# Patient Record
Sex: Female | Born: 1990 | Race: White | Hispanic: No | Marital: Married | Smoking: Former smoker
Health system: Southern US, Community
[De-identification: ages and names within clinical notes are randomized; demographics above are authoritative.]

## PROBLEM LIST (undated history)

## (undated) DIAGNOSIS — E039 Hypothyroidism, unspecified: Secondary | ICD-10-CM

## (undated) DIAGNOSIS — Z789 Other specified health status: Secondary | ICD-10-CM

## (undated) HISTORY — DX: Hypothyroidism, unspecified: E03.9

## (undated) HISTORY — PX: APPENDECTOMY: SHX54

---
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
## 2009-02-14 ENCOUNTER — Emergency Department (HOSPITAL_BASED_OUTPATIENT_CLINIC_OR_DEPARTMENT_OTHER): Admission: EM | Admit: 2009-02-14 | Discharge: 2009-02-14 | Payer: Self-pay | Admitting: Emergency Medicine

## 2009-02-18 ENCOUNTER — Ambulatory Visit: Payer: Self-pay | Admitting: Radiology

## 2009-02-18 ENCOUNTER — Ambulatory Visit (HOSPITAL_BASED_OUTPATIENT_CLINIC_OR_DEPARTMENT_OTHER): Admission: RE | Admit: 2009-02-18 | Discharge: 2009-02-18 | Payer: Self-pay | Admitting: Emergency Medicine

## 2009-08-11 ENCOUNTER — Emergency Department (HOSPITAL_COMMUNITY): Admission: EM | Admit: 2009-08-11 | Discharge: 2009-08-11 | Payer: Self-pay | Admitting: Family Medicine

## 2009-12-28 IMAGING — CT CT ABDOMEN W/ CM
2 of 4 series · 16 of 46 positions shown, 18 images · IV contrast (APPLIED)
Comparison: None

CT ABDOMEN

CLINICAL DATA: Right lower quadrant pain, nausea.

CT ABDOMEN AND PELVIS WITH CONTRAST
TECHNIQUE: Multidetector CT imaging of the abdomen and pelvis was
performed using the standard protocol following bolus
administration of intravenous contrast.
Contrast: 100 ml 7mnipaque-UDD

[Series 2: abd/pelvis 5.0 b31f · axial · 0.76mm/px · z∈[-477,-47]mm · 13 of 94 slices shown, 15 images]
[im 4/94  soft-tissue]
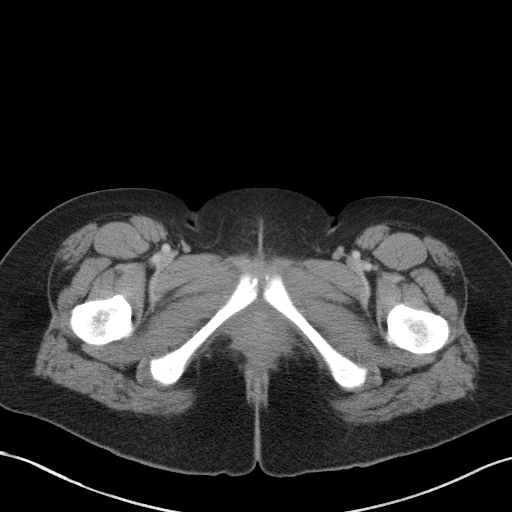
[im 4/94  bone]
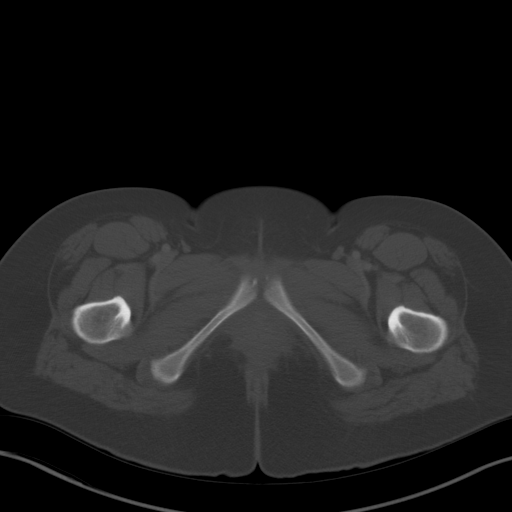
[im 12/94  soft-tissue]
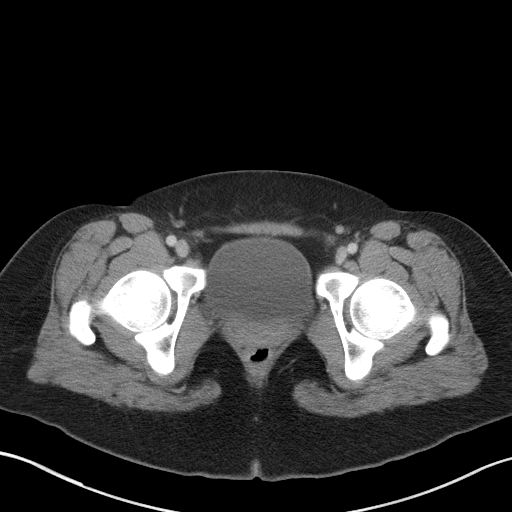
[im 20/94  soft-tissue]
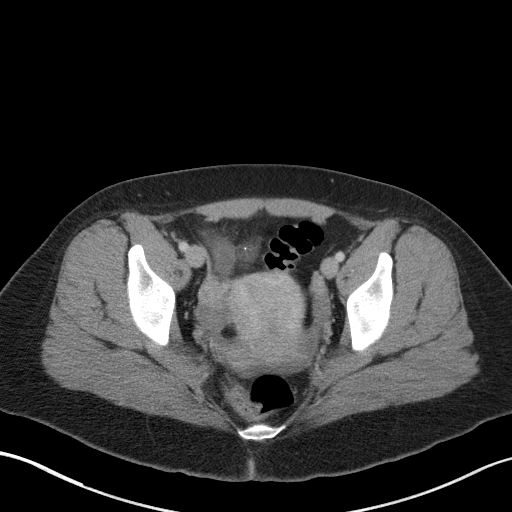
[im 28/94  soft-tissue]
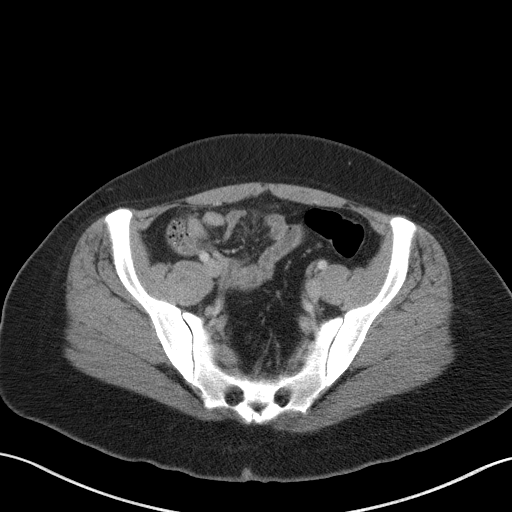
[im 32/94  soft-tissue]
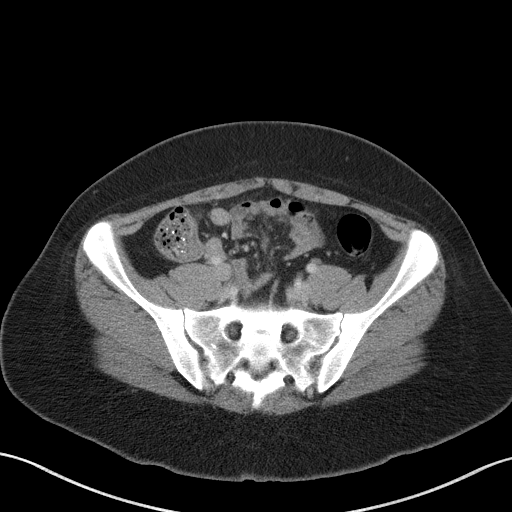
[im 39/94  soft-tissue]
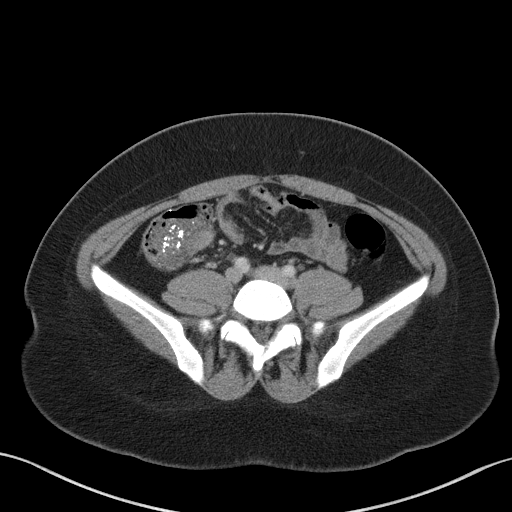
[im 47/94  soft-tissue]
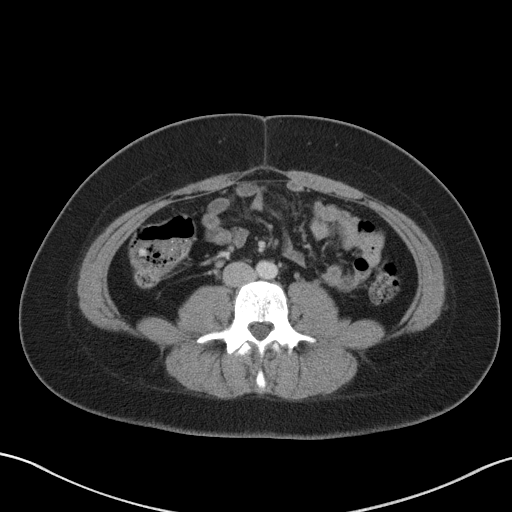
[im 55/94  soft-tissue]
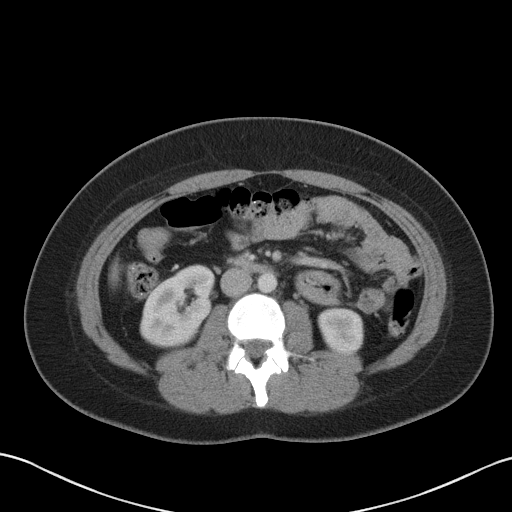
[im 63/94  soft-tissue]
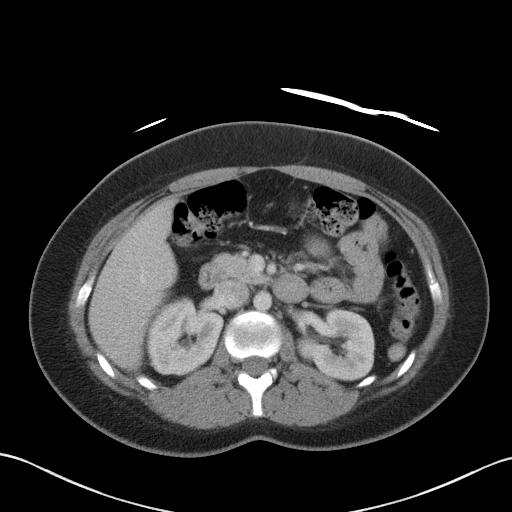
[im 63/94  bone]
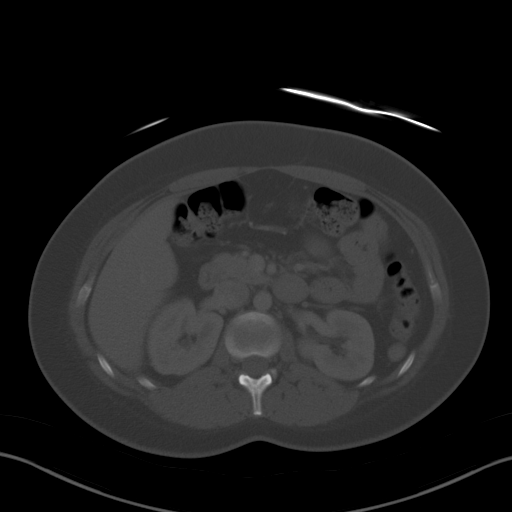
[im 66/94  soft-tissue]
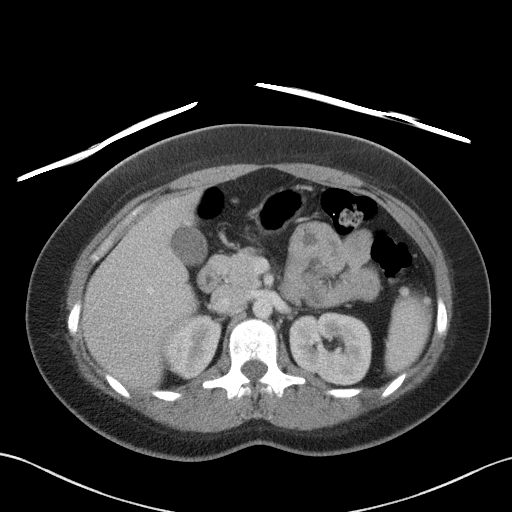
[im 74/94  soft-tissue]
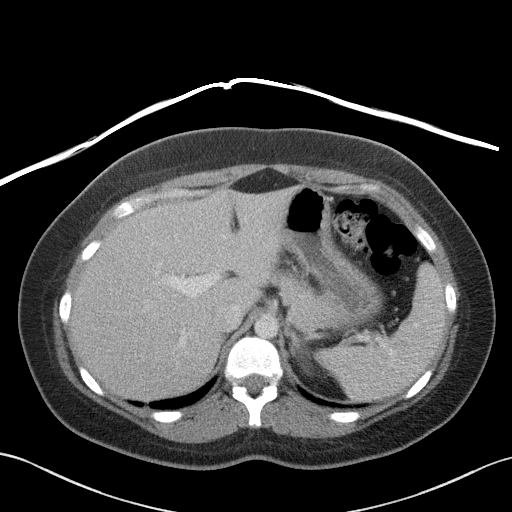
[im 82/94  soft-tissue]
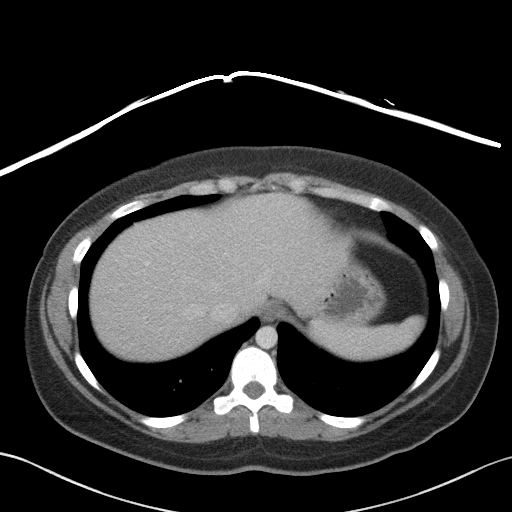
[im 90/94  soft-tissue]
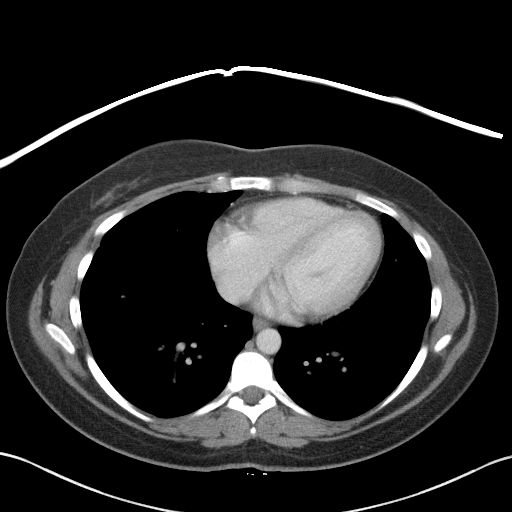

[Series 5: abd/pelvis 3.0 coronal · coronal · 0.78mm/px · 3 of 82 slices shown]
[im 28/82  soft-tissue]
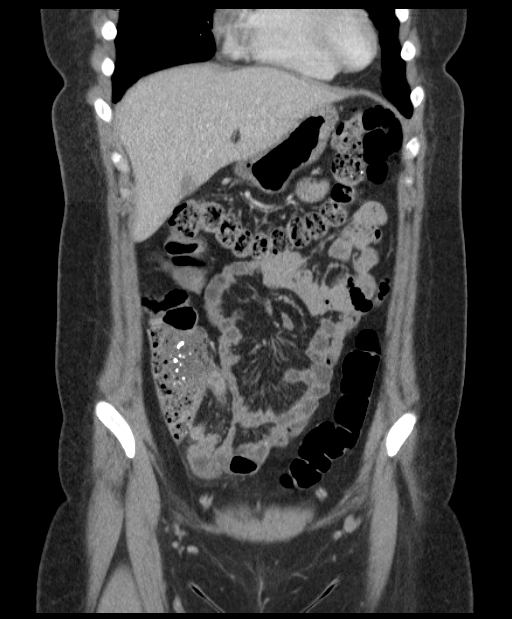
[im 37/82  soft-tissue]
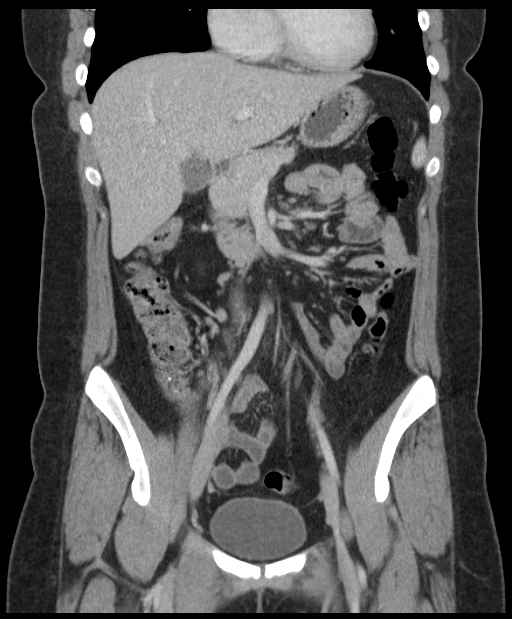
[im 46/82  soft-tissue]
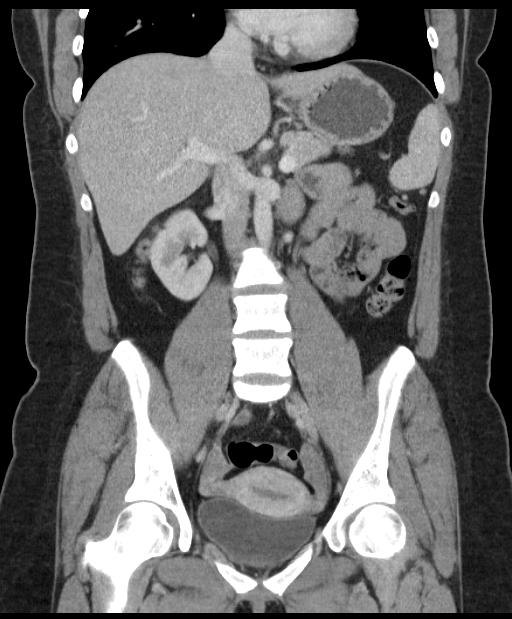

[16 of 46 positions shown; findings below may reference images not displayed]

FINDINGS: Liver, spleen, pancreas, adrenals, kidneys unremarkable.
No hydronephrosis. No biliary ductal dilatation. Bowel grossly
unremarkable.  No free fluid, free air, or adenopathy. Lung bases
are clear.  No effusions.  Heart is normal size. Aorta is normal
caliber.  No acute bony abnormality.
IMPRESSION: Unremarkable CT abdomen.

CT PELVIS
FINDINGS: The appendix is mildly dilated at 12 mm.  Early
stranding noted around the appendix.  There may be a small
appendicolith within the lumen.  Findings are concerning for early
appendicitis.

Small to moderate free fluid in the pelvis.  Uterus and adnexa
grossly unremarkable.  Pelvic small bowel unremarkable.

High density material noted within the distal small bowel and
cecum, presumably ingested material, possibly medications.
IMPRESSION: Mildly dilated appendix with early inflammatory change.  Findings
are concerning for early appendicitis.

Small to moderate free fluid in the pelvis.

## 2010-03-26 LAB — POCT URINALYSIS DIPSTICK
Hgb urine dipstick: NEGATIVE
Ketones, ur: NEGATIVE mg/dL
Nitrite: NEGATIVE
Specific Gravity, Urine: 1.015 (ref 1.005–1.030)
pH: 7.5 (ref 5.0–8.0)

## 2010-03-26 LAB — POCT PREGNANCY, URINE: Preg Test, Ur: NEGATIVE

## 2010-03-31 LAB — CBC
HCT: 40.2 % (ref 36.0–46.0)
Hemoglobin: 13.8 g/dL (ref 12.0–15.0)
WBC: 10.8 10*3/uL — ABNORMAL HIGH (ref 4.0–10.5)

## 2010-03-31 LAB — GC/CHLAMYDIA PROBE AMP, GENITAL: GC Probe Amp, Genital: NEGATIVE

## 2010-03-31 LAB — BASIC METABOLIC PANEL
Calcium: 9.7 mg/dL (ref 8.4–10.5)
Creatinine, Ser: 0.7 mg/dL (ref 0.4–1.2)
Glucose, Bld: 92 mg/dL (ref 70–99)
Potassium: 4.4 mEq/L (ref 3.5–5.1)

## 2010-03-31 LAB — URINALYSIS, ROUTINE W REFLEX MICROSCOPIC
Bilirubin Urine: NEGATIVE
Glucose, UA: NEGATIVE mg/dL
Ketones, ur: NEGATIVE mg/dL
Nitrite: NEGATIVE
Specific Gravity, Urine: 1.008 (ref 1.005–1.030)

## 2010-03-31 LAB — DIFFERENTIAL
Basophils Absolute: 0.3 10*3/uL — ABNORMAL HIGH (ref 0.0–0.1)
Basophils Relative: 3 % — ABNORMAL HIGH (ref 0–1)
Eosinophils Absolute: 0.1 10*3/uL (ref 0.0–0.7)
Eosinophils Relative: 1 % (ref 0–5)
Monocytes Absolute: 0.3 10*3/uL (ref 0.1–1.0)
Monocytes Relative: 3 % (ref 3–12)

## 2010-03-31 LAB — WET PREP, GENITAL: Yeast Wet Prep HPF POC: NONE SEEN

## 2010-03-31 LAB — PREGNANCY, URINE: Preg Test, Ur: NEGATIVE

## 2010-03-31 LAB — RPR: RPR Ser Ql: NONREACTIVE

## 2010-04-21 LAB — URINALYSIS, ROUTINE W REFLEX MICROSCOPIC
Bilirubin Urine: NEGATIVE
Glucose, UA: NEGATIVE mg/dL
Nitrite: NEGATIVE
Protein, ur: NEGATIVE mg/dL
Specific Gravity, Urine: 1.028 (ref 1.005–1.030)
pH: 6 (ref 5.0–8.0)

## 2010-04-21 LAB — COMPREHENSIVE METABOLIC PANEL
ALT: 17 U/L (ref 0–35)
AST: 21 U/L (ref 0–37)
Albumin: 4.3 g/dL (ref 3.5–5.2)
Alkaline Phosphatase: 75 U/L (ref 39–117)
GFR calc non Af Amer: >60 mL/min (ref >60)
Potassium: 3.9 mEq/L (ref 3.5–5.1)
Total Bilirubin: 0.6 mg/dL (ref 0.3–1.2)

## 2010-04-21 LAB — PREGNANCY, URINE: Preg Test, Ur: NEGATIVE

## 2010-04-21 LAB — DIFFERENTIAL
Basophils Absolute: 0.3 10*3/uL — ABNORMAL HIGH (ref 0.0–0.1)
Neutro Abs: 6.8 10*3/uL (ref 1.7–7.7)
Neutrophils Relative %: 67 % (ref 43–77)

## 2010-04-21 LAB — CBC
HCT: 35.5 % — ABNORMAL LOW (ref 36.0–46.0)
Hemoglobin: 12.3 g/dL (ref 12.0–15.0)
MCV: 84.4 fL (ref 78.0–100.0)
Platelets: 256 10*3/uL (ref 150–400)
RBC: 4.2 MIL/uL (ref 3.87–5.11)
RDW: 12.7 % (ref 11.5–15.5)
WBC: 10.2 10*3/uL (ref 4.0–10.5)

## 2010-04-21 LAB — LIPASE, BLOOD: Lipase: 42 U/L (ref 23–300)

## 2010-05-25 NOTE — Op Note (Signed)
NAMEECHO, PROPP               ACCOUNT NO.:  [id number]   MEDICAL RECORD NO.:  [id number]          PATIENT TYPE:  INP   LOCATION:  5506                         FACILITY:  [facility]   PHYSICIAN:  Velora Heckler, MD      DATE OF BIRTH:  20-Dec-1990   DATE OF PROCEDURE:  04/25/2008  DATE OF DISCHARGE:                               OPERATIVE REPORT   PREOPERATIVE DIAGNOSIS:  Acute appendicitis.   POSTOPERATIVE DIAGNOSIS:  Acute appendicitis.   PROCEDURE:  Laparoscopic appendectomy.   SURGEON:  Velora Heckler, MD, FACS   ANESTHESIA:  General per Dr. Sheldon Silvan.   ESTIMATED BLOOD LOSS:  Minimal.   PREPARATION:  ChloraPrep.   COMPLICATIONS:  None.   INDICATIONS:  The patient is a 20 year old white female who presents  with signs and symptoms of acute appendicitis.  She was seen at [facility].  She underwent CT scan abdomen and pelvis which  demonstrated findings consistent with acute appendicitis.  The patient  was transferred to [facility] and admitted on the General  Surgical Service.  She is now prepared and brought to the operating  room.   BODY OF REPORT:  Procedure was done in OR #70 at the [facility].  The patient was brought to the operating room,  placed in supine position on the operating room table.  Following  administration of general anesthesia, the patient was positioned and  then prepped and draped in usual strict aseptic fashion.  After  ascertaining that an adequate level of anesthesia had been achieved, an  infraumbilical incision was made in the midline with a #15 blade.  Dissection was carried down through subcutaneous tissues.  Fascia was  incised in the midline and the peritoneal cavity was entered cautiously.  A 0 Vicryl pursestring suture was placed in the fascia.  An Hasson  cannula was introduced and secured with the pursestring suture.  Abdomen  was insufflated with carbon dioxide.  Laparoscope was  introduced and the  abdomen was explored.  Operative ports were placed in the right upper  quadrant and left lower quadrant.  Omentum was mobilized.  Cecum was  lifted in the tense, distended, indurated, appendix was evident.  There  was no sign of perforation.  Using the Harmonic scalpel, the  mesoappendix was divided.  Dissection was carried down to the base of  the appendix and the appendix was cleared circumferentially.  Using an  Endo-GIA stapler, the base of the appendix was transected at its  junction with the cecal wall.  Good hemostasis was noted along the  staple line.  The appendix was placed into an EndoCatch bag and  withdrawn through the umbilical port without difficulty.  The 0 Vicryl  pursestring sutures tied securely.  Right lower quadrant was  irrigated  with warm saline.  Staple line was again inspected for hemostasis.  Fluid was evacuated.  No evidence of abscess.  Pneumoperitoneum was  released.  Ports were removed under direct vision and good hemostasis  was noted at all port sites.  Skin incisions were  anesthetized with  local anesthetic.  All wounds were  closed with interrupted 4-0 Monocryl subcuticular sutures.  Wounds were  washed and dried and benzoin and Steri-Strips were applied.  Sterile  dressings were applied.  The patient was awakened from anesthesia and  brought to the recovery room in stable condition.  The patient tolerated  the procedure well.       Velora Heckler, MD  Electronically Signed     TMG/MEDQ  D:  04/25/2008  T:  04/26/2008  Job:  [id number]   cc:   Velora Heckler, MD

## 2010-05-25 NOTE — Discharge Summary (Signed)
NAMEPAQUITA, Ann Hammond               ACCOUNT NO.:  [id number]   MEDICAL RECORD NO.:  [id number]          PATIENT TYPE:  INP   LOCATION:  5506                         FACILITY:  [facility]   PHYSICIAN:  Lennie Muckle, MD      DATE OF BIRTH:  10-08-1990   DATE OF ADMISSION:  04/25/2008  DATE OF DISCHARGE:  04/26/2008                               DISCHARGE SUMMARY   FINAL DIAGNOSIS:  Acute appendicitis.   PROCEDURE:  On April 25, 2008, laparoscopic appendectomy.   HOSPITAL COURSE:  [name] was admitted following laparoscopic  appendectomy for acute appendicitis on April 25, 2008.  She was kept  overnight for pain control and received IV postoperative antibiotics.  She tolerated breakfast this morning without nausea or vomiting.  Pain  has been controlled with Vicodin.  Incision sites have dressings which  are clean.  She has some mild abdominal distention, but overall is doing  well.  She will be discharged home with Vicodin.  She is instructed to  perform no heavy lifting over 20 pounds for 4 weeks.  Follow up in  clinic in approximately 2-3 weeks.  Call if she develops fevers, chills,  or increasing abdominal pain.  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   FINAL CONDITION:  Improved and discharged home.      Lennie Muckle, MD  Electronically Signed     ALA/MEDQ  D:  04/26/2008  T:  04/27/2008  Job:  [id number]   cc:   Velora Heckler, MD

## 2010-05-25 NOTE — H&P (Signed)
NAMEBREAWNA, Ann Hammond               ACCOUNT NO.:  [id number]   MEDICAL RECORD NO.:  [id number]          PATIENT TYPE:  INP   LOCATION:  5506                         FACILITY:  [facility]   PHYSICIAN:  Velora Heckler, MD      DATE OF BIRTH:  1990/07/09   DATE OF ADMISSION:  04/25/2008  DATE OF DISCHARGE:                              HISTORY & PHYSICAL   REQUESTING PHYSICIAN:  Annye Rusk, MD, with [facility] Emergency  Department.   ADMITTING PHYSICIAN:  Velora Heckler, MD   CHIEF COMPLAINT:  Abdominal pain.   HISTORY OF PRESENT ILLNESS:  Ann Hammond is a 20 year old white female who has  no past medical history, who began having periumbilical pain yesterday  around lunchtime.  She developed some nausea but no emesis.  Later in  the day, her pain moved to her right lower quadrant and worsened.  She  states at this time, she has no chance of being pregnant, and her last  menstrual cycle was approximately 3 weeks ago.  Due to her pain, she  presented to [facility] Emergency Department, where a CT scan of the  abdomen and pelvis was done, which showed early appendicitis.  Because  of this, she was transferred from [facility] Emergency Department to  [facility] for surgical intervention.   REVIEW OF SYSTEMS:  Please see HPI, otherwise all other systems are  negative.   FAMILY HISTORY:  Noncontributory.   PAST MEDICAL HISTORY:  None.   PAST SURGICAL HISTORY:  None.   SOCIAL HISTORY:  The patient has been married for 3 years.  She is a  social tobacco abuser.  Otherwise, denies alcohol or illicit drug use.  She currently does not have any kids.   ALLERGIES:  NKDA.   MEDICATIONS:  1. Calcium with vitamin D 600 mg b.i.d.  2. Norinyl, I believe this is her birth control that she takes daily.  3. Multivitamin.   PHYSICAL EXAMINATION:  GENERAL:  Ann Hammond is a 20 year old white female who  is currently lying in bed in no acute distress.  VITAL SIGNS:  Temperature 98.7, blood pressure 131/87,  pulse 98,  respirations 18.  HEENT:  Head is normocephalic, atraumatic.  Sclerae noninjected.  Pupils  are equal, round, and reactive to light.  Ears and nose without any  obvious masses or lesions.  No rhinorrhea.  Mouth is pink and moist.  Throat shows no exudate.  NECK:  Supple.  Trachea is midline.  No thyromegaly.  HEART:  Regular rate and rhythm.  Normal S1, S2.  No murmurs, gallops,  or rubs are noted.  Carotid, radial, and pedal pulses 2+ bilaterally.  LUNGS:  Clear to auscultation bilaterally with no wheezes, rhonchi, or  rales noted.  Respiratory effort is nonlabored.  ABDOMEN:  Soft.  Point tender in the right lower quadrant.  She does  have active bowel sounds and is otherwise nondistended.  She does not  have any peritoneal signs or other masses or hernias or scars noted.  SKIN:  Warm and dry.  NEURO:  Cranial nerves II through XII appear to be grossly intact.  PSYCH:  The patient is alert and oriented x3 with an appropriate affect.   LABORATORY DATA AND DIAGNOSTICS:  Sodium 141, potassium 3.9, glucose 86,  BUN 12, creatinine 0.7.  White blood cell count is 10.2, hemoglobin  12.3, hematocrit 35.5, platelet count is 256,000.  Diagnostics, CT of  the abdomen and pelvis revealed a mildly dilated appendix with early  inflammatory changes consistent with early appendicitis.   IMPRESSION:  Acute appendicitis.   PLAN:  At this time, the patient is admitted.  She has been started on  IV fluids as well as Zosyn prophylactically.  At this time, the risks  and benefits of surgery has been explained to the patient including  bleeding, infection, and injury to surrounding organs.  The patient  understands these risks, and at this time, she wishes to proceed with  surgical intervention.      Letha Cape, PA      Velora Heckler, MD  Electronically Signed    KEO/MEDQ  D:  04/25/2008  T:  04/26/2008  Job:  [id number]

## 2010-07-22 LAB — GC/CHLAMYDIA PROBE AMP, GENITAL: Gonorrhea: NEGATIVE

## 2010-08-16 LAB — HIV ANTIBODY (ROUTINE TESTING W REFLEX): HIV: NONREACTIVE

## 2010-08-16 LAB — ABO/RH: RH Type: NEGATIVE

## 2010-08-16 LAB — ANTIBODY SCREEN: Antibody Screen: NEGATIVE

## 2010-08-16 LAB — HEPATITIS B SURFACE ANTIGEN: Hepatitis B Surface Ag: NEGATIVE

## 2010-10-23 IMAGING — US US TRANSVAGINAL NON-OB
1 series · 14 of 25 positions shown · non-contrast
Comparison: None.

February 20, 2009 –DUPLICATE COPY for exam association in [facility]. No change from original report.
CLINICAL DATA: Heavy vaginal bleeding

 TRANSABDOMINAL AND TRANSVAGINAL ULTRASOUND OF PELVIS
TECHNIQUE: Both transabdominal and transvaginal ultrasound
 examinations of the pelvis were performed including evaluation of
 the uterus, ovaries, adnexal regions, and pelvic cul-de-sac.

[Series 1: us transvaginal non-ob · 0.35mm/px · 14 of 54 slices shown]
[im 1/54]
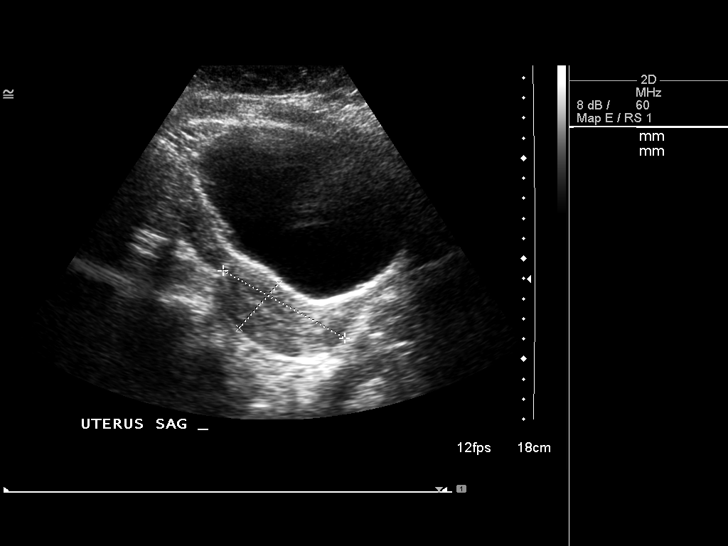
[im 5/54]
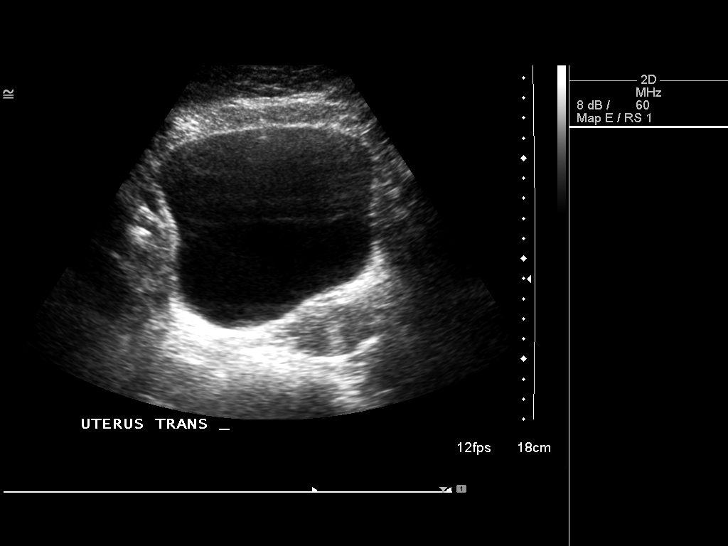
[im 9/54]
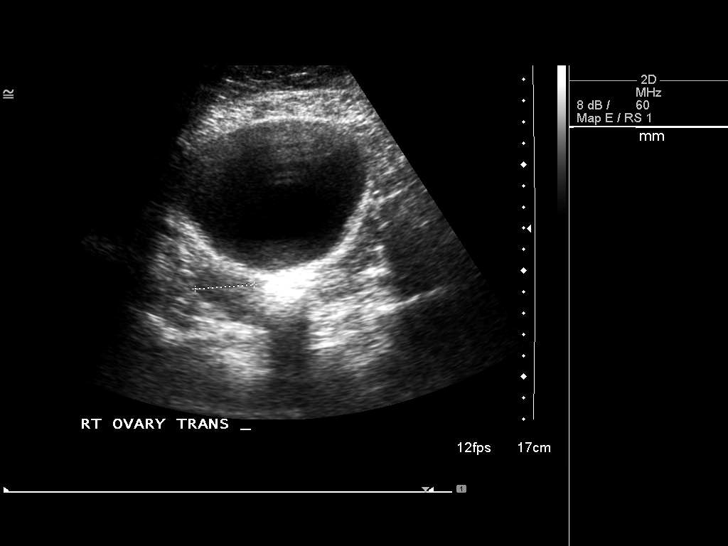
[im 14/54]
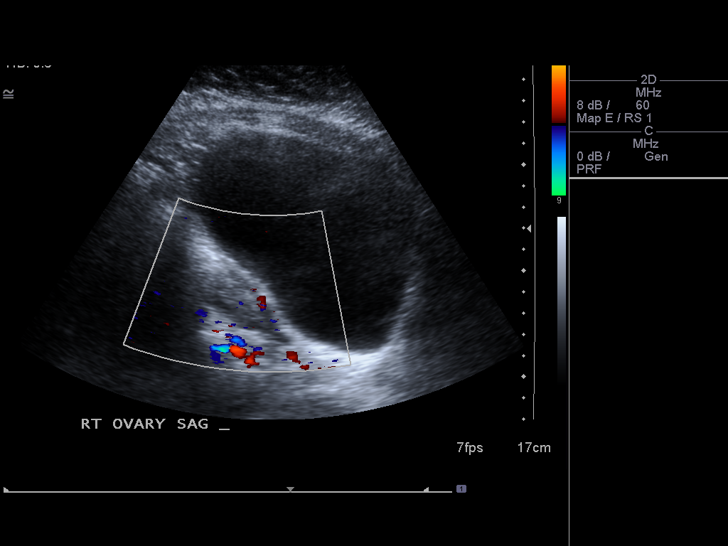
[im 18/54]
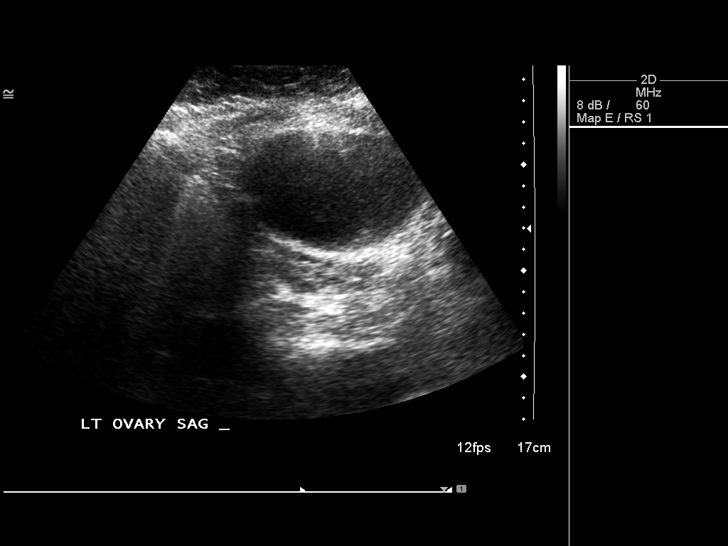
[im 20/54]
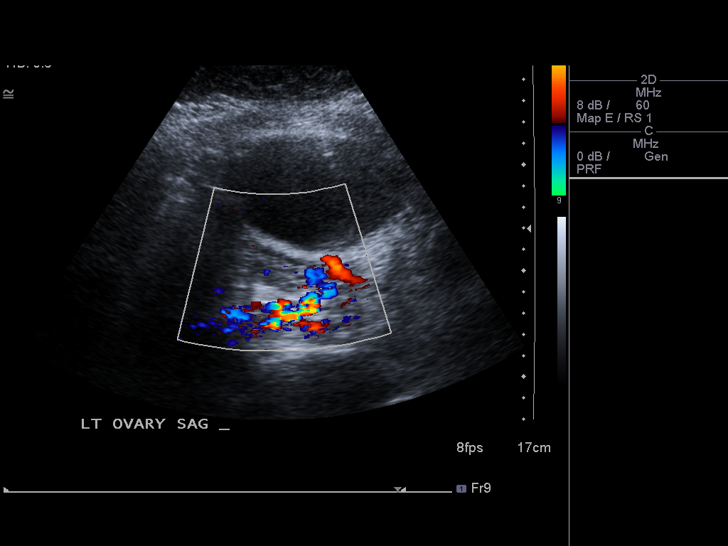
[im 25/54]
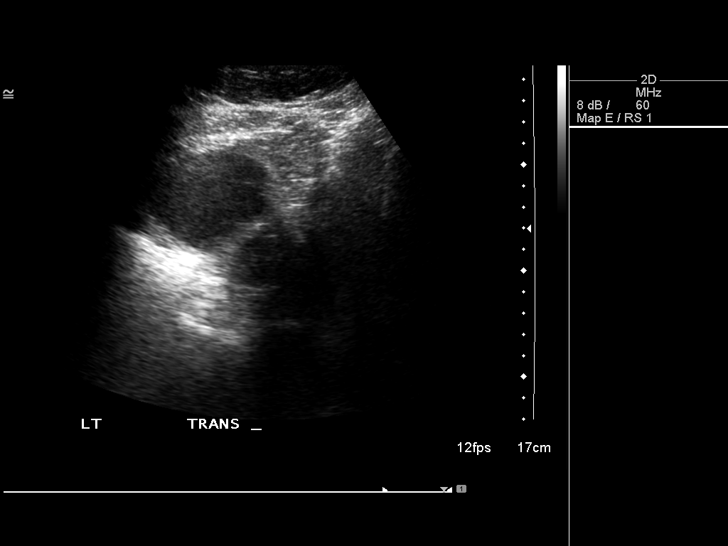
[im 29/54]
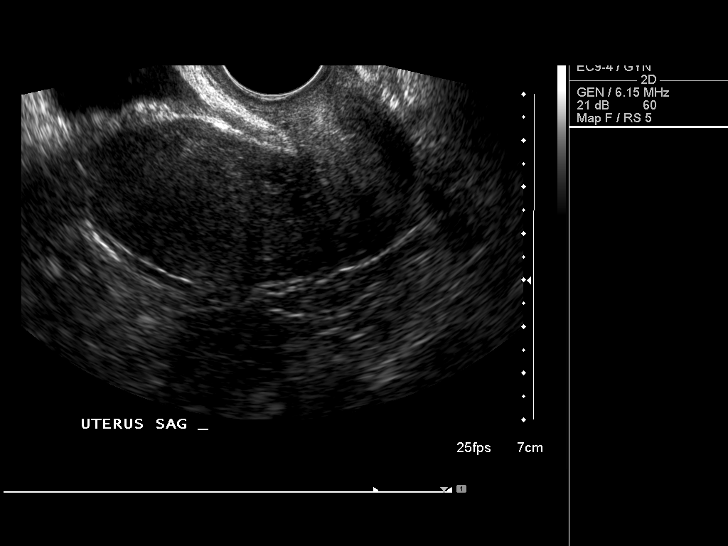
[im 34/54]
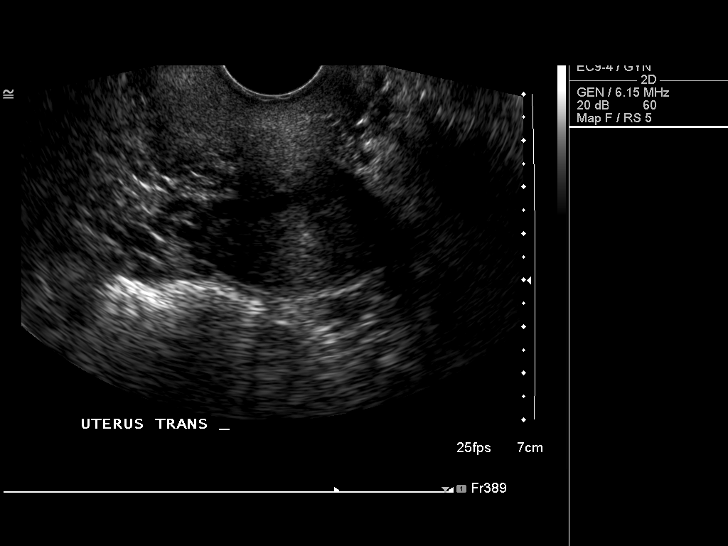
[im 36/54]
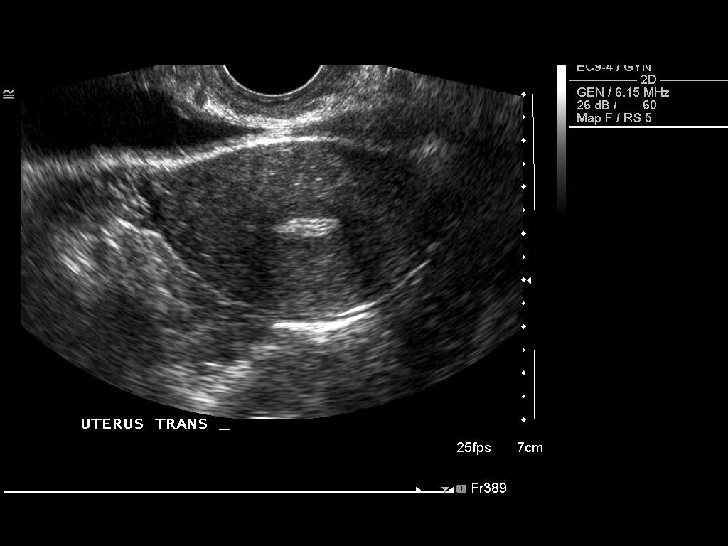
[im 40/54]
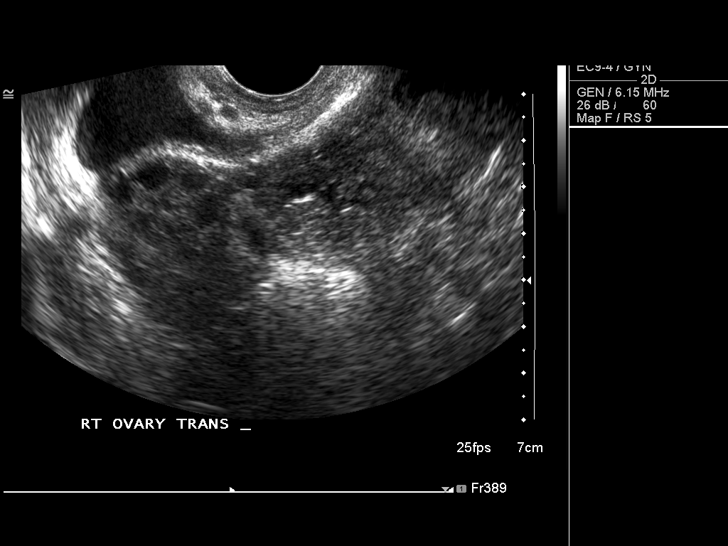
[im 45/54]
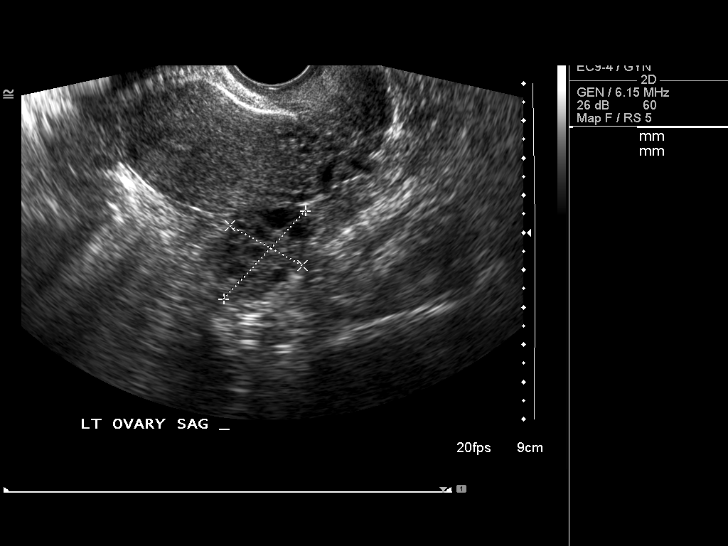
[im 49/54]
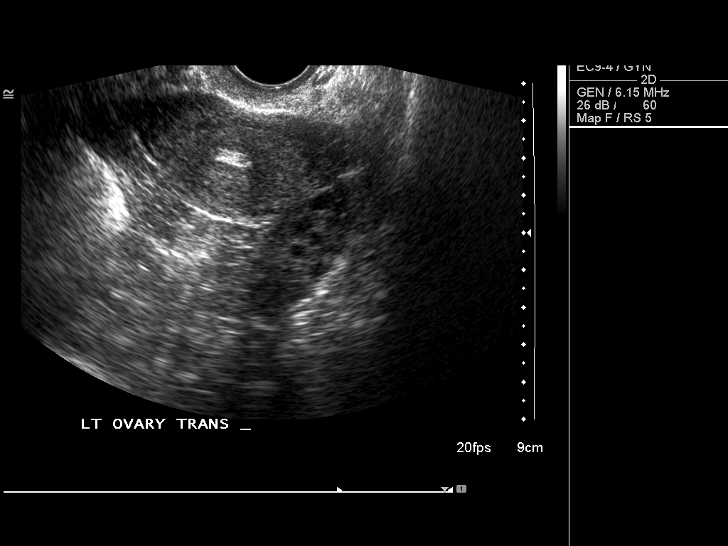
[im 54/54]
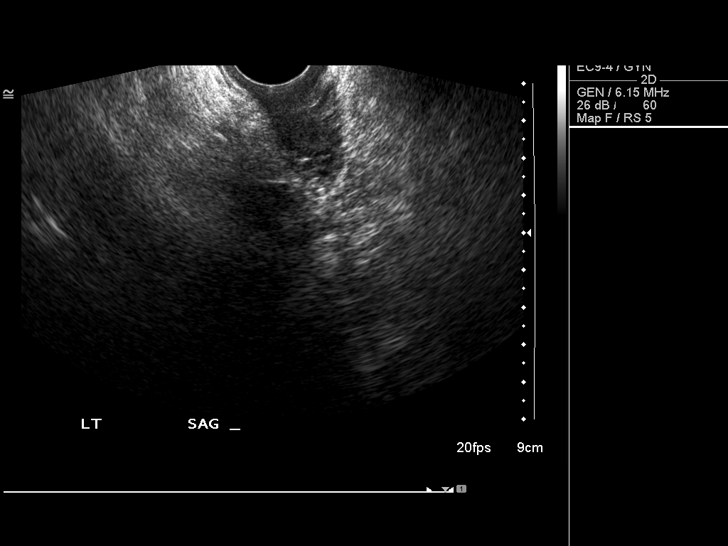

[14 of 25 positions shown; findings below may reference images not displayed]

FINDINGS: Uterus normal in size and contour. No focal lesions of the
 myometrium.

 Endometrium 4 mm in thickness. There is a small amount of fluid in
 the endometrial canal.

 Right Ovary normal in size and contour with several follicles. No
 dominant cysts or masses.

 Left Ovary normal in size and contour with several follicles. No
 dominant cysts or masses.

 Other Findings: No free pelvic fluid or other significant
 findings.
IMPRESSION: Normal exam. There is a small amount of fluid in the endometrial
 canal.

## 2011-01-11 NOTE — L&D Delivery Note (Signed)
NSD of viable 7 pound 15 oz. Female, Apgars 8/9 over 3rd degree tear.  Meconium Stained AF noted earlier with amniotomy--however baby showed no effects from mec - Dr. Alison Murray present shortly after delivery.  Baby to be breast fed and to regular nursery.  Placenta with slightly stained membranes.  Grossly noraml otherwise.  2A/1V.  Tear repaired without difficulty with 2-0 and 3-0 vicryl.

## 2011-01-27 LAB — STREP B DNA PROBE: GBS: NEGATIVE

## 2011-02-19 ENCOUNTER — Inpatient Hospital Stay (HOSPITAL_COMMUNITY)
Admission: AD | Admit: 2011-02-19 | Discharge: 2011-02-19 | Disposition: A | Discharge status: Home / Self Care | Payer: 59 | Source: Ambulatory Visit | Attending: Obstetrics & Gynecology | Admitting: Obstetrics & Gynecology

## 2011-02-19 ENCOUNTER — Inpatient Hospital Stay (HOSPITAL_COMMUNITY)
Admission: AD | Admit: 2011-02-19 | Discharge: 2011-02-20 | Disposition: A | Discharge status: Home / Self Care | Payer: 59 | Source: Ambulatory Visit | Attending: Obstetrics & Gynecology | Admitting: Obstetrics & Gynecology

## 2011-02-19 ENCOUNTER — Encounter (HOSPITAL_COMMUNITY): Payer: Self-pay | Admitting: *Deleted

## 2011-02-19 DIAGNOSIS — O479 False labor, unspecified: Secondary | ICD-10-CM | POA: Insufficient documentation

## 2011-02-19 HISTORY — DX: Other specified health status: Z78.9

## 2011-02-19 NOTE — Progress Notes (Signed)
Notified of no change in SVE from office. Normal bloody show noted.  Order to d/c pt home.

## 2011-02-19 NOTE — Progress Notes (Signed)
Pt reports having back and abd cramping on and off since 1am. Reports some vaginal bleeding and leaking noted. Reports good fetal movement

## 2011-02-19 NOTE — Progress Notes (Signed)
Pt states, " I've been here twice today, but now I'm hurting more and they contractions are more regular and about three minutes aprt."

## 2011-02-20 ENCOUNTER — Encounter (HOSPITAL_COMMUNITY): Payer: Self-pay | Admitting: Anesthesiology

## 2011-02-20 ENCOUNTER — Inpatient Hospital Stay (HOSPITAL_COMMUNITY)
Admission: AD | Admit: 2011-02-20 | Discharge: 2011-02-22 | DRG: 775 | Disposition: A | Discharge status: Home / Self Care | Payer: 59 | Source: Ambulatory Visit | Attending: Obstetrics & Gynecology | Admitting: Obstetrics & Gynecology

## 2011-02-20 ENCOUNTER — Encounter (HOSPITAL_COMMUNITY): Payer: Self-pay | Admitting: Obstetrics and Gynecology

## 2011-02-20 ENCOUNTER — Inpatient Hospital Stay (HOSPITAL_COMMUNITY): Payer: 59 | Admitting: Anesthesiology

## 2011-02-20 ENCOUNTER — Encounter (HOSPITAL_COMMUNITY): Payer: Self-pay | Admitting: *Deleted

## 2011-02-20 LAB — CBC
HCT: 37.5 % (ref 36.0–46.0)
Platelets: 171 10*3/uL (ref 150–400)
RDW: 13.2 % (ref 11.5–15.5)
WBC: 13.2 10*3/uL — ABNORMAL HIGH (ref 4.0–10.5)

## 2011-02-20 LAB — RPR: RPR Ser Ql: NONREACTIVE

## 2011-02-20 LAB — ABO/RH: ABO/RH(D): B NEG

## 2011-02-20 MED ORDER — DIPHENHYDRAMINE HCL 50 MG/ML IJ SOLN: 12.5000 mg | INTRAMUSCULAR | Status: DC | PRN

## 2011-02-20 MED ORDER — ACETAMINOPHEN 325 MG PO TABS: 650.0000 mg | ORAL_TABLET | ORAL | Status: DC | PRN

## 2011-02-20 MED ORDER — ONDANSETRON HCL 4 MG/2ML IJ SOLN: 4.0000 mg | Freq: Four times a day (QID) | INTRAMUSCULAR | Status: DC | PRN

## 2011-02-20 MED ORDER — CITRIC ACID-SODIUM CITRATE 334-500 MG/5ML PO SOLN: 30.0000 mL | ORAL | Status: DC | PRN

## 2011-02-20 MED ORDER — OXYCODONE-ACETAMINOPHEN 5-325 MG PO TABS
1.0000 | ORAL_TABLET | ORAL | Status: DC | PRN
  Administered 2011-02-20: 2 via ORAL
  Filled 2011-02-20: qty 2

## 2011-02-20 MED ORDER — FENTANYL 2.5 MCG/ML BUPIVACAINE 1/10 % EPIDURAL INFUSION (WH - ANES)
INTRAMUSCULAR | Status: DC | PRN
  Administered 2011-02-20: 14 mL/h via EPIDURAL

## 2011-02-20 MED ORDER — LACTATED RINGERS IV SOLN: 500.0000 mL | INTRAVENOUS | Status: DC | PRN

## 2011-02-20 MED ORDER — LIDOCAINE HCL (PF) 1 % IJ SOLN
30.0000 mL | INTRAMUSCULAR | Status: DC | PRN
  Filled 2011-02-20: qty 30

## 2011-02-20 MED ORDER — EPHEDRINE 5 MG/ML INJ: 10.0000 mg | INTRAVENOUS | Status: DC | PRN

## 2011-02-20 MED ORDER — PHENYLEPHRINE 40 MCG/ML (10ML) SYRINGE FOR IV PUSH (FOR BLOOD PRESSURE SUPPORT): 80.0000 ug | PREFILLED_SYRINGE | INTRAVENOUS | Status: DC | PRN

## 2011-02-20 MED ORDER — LACTATED RINGERS IV SOLN
500.0000 mL | Freq: Once | INTRAVENOUS | Status: AC
  Administered 2011-02-20: 500 mL via INTRAVENOUS

## 2011-02-20 MED ORDER — LIDOCAINE HCL (PF) 1 % IJ SOLN
INTRAMUSCULAR | Status: DC | PRN
  Administered 2011-02-20 (×2): 5 mL

## 2011-02-20 MED ORDER — LACTATED RINGERS IV SOLN: INTRAVENOUS | Status: DC

## 2011-02-20 MED ORDER — LIDOCAINE HCL (PF) 1 % IJ SOLN: 30.0000 mL | INTRAMUSCULAR | Status: DC | PRN

## 2011-02-20 MED ORDER — FENTANYL 2.5 MCG/ML BUPIVACAINE 1/10 % EPIDURAL INFUSION (WH - ANES): 14.0000 mL/h | INTRAMUSCULAR | Status: DC

## 2011-02-20 MED ORDER — ZOLPIDEM TARTRATE 5 MG PO TABS
5.0000 mg | ORAL_TABLET | Freq: Once | ORAL | Status: AC
  Administered 2011-02-20: 5 mg via ORAL
  Filled 2011-02-20: qty 1

## 2011-02-20 MED ORDER — OXYTOCIN BOLUS FROM INFUSION
500.0000 mL | Freq: Once | INTRAVENOUS | Status: DC
  Filled 2011-02-20: qty 500
  Filled 2011-02-20: qty 1000

## 2011-02-20 MED ORDER — LACTATED RINGERS IV SOLN
INTRAVENOUS | Status: DC
  Administered 2011-02-20 (×2): via INTRAVENOUS

## 2011-02-20 MED ORDER — BUTORPHANOL TARTRATE 2 MG/ML IJ SOLN: 1.0000 mg | INTRAMUSCULAR | Status: DC | PRN

## 2011-02-20 MED ORDER — OXYTOCIN BOLUS FROM INFUSION
500.0000 mL | Freq: Once | INTRAVENOUS | Status: DC
  Filled 2011-02-20: qty 500

## 2011-02-20 MED ORDER — OXYTOCIN 20 UNITS IN LACTATED RINGERS INFUSION - SIMPLE: 125.0000 mL/h | Freq: Once | INTRAVENOUS | Status: DC

## 2011-02-20 MED ORDER — PHENYLEPHRINE 40 MCG/ML (10ML) SYRINGE FOR IV PUSH (FOR BLOOD PRESSURE SUPPORT)
PREFILLED_SYRINGE | INTRAVENOUS | Status: AC
  Filled 2011-02-20: qty 5

## 2011-02-20 MED ORDER — OXYTOCIN 20 UNITS IN LACTATED RINGERS INFUSION - SIMPLE
125.0000 mL/h | Freq: Once | INTRAVENOUS | Status: AC
  Administered 2011-02-20: 999 mL/h via INTRAVENOUS

## 2011-02-20 MED ORDER — IBUPROFEN 600 MG PO TABS
600.0000 mg | ORAL_TABLET | Freq: Four times a day (QID) | ORAL | Status: DC | PRN
  Administered 2011-02-20: 600 mg via ORAL
  Filled 2011-02-20: qty 1

## 2011-02-20 MED ORDER — IBUPROFEN 600 MG PO TABS: 600.0000 mg | ORAL_TABLET | Freq: Four times a day (QID) | ORAL | Status: DC | PRN

## 2011-02-20 MED ORDER — EPHEDRINE 5 MG/ML INJ
INTRAVENOUS | Status: AC
  Filled 2011-02-20: qty 4

## 2011-02-20 MED ORDER — FLEET ENEMA 7-19 GM/118ML RE ENEM: 1.0000 | ENEMA | RECTAL | Status: DC | PRN

## 2011-02-20 MED ORDER — OXYCODONE-ACETAMINOPHEN 5-325 MG PO TABS: 1.0000 | ORAL_TABLET | ORAL | Status: DC | PRN

## 2011-02-20 MED ORDER — FENTANYL 2.5 MCG/ML BUPIVACAINE 1/10 % EPIDURAL INFUSION (WH - ANES)
INTRAMUSCULAR | Status: AC
  Filled 2011-02-20: qty 60

## 2011-02-20 NOTE — Progress Notes (Signed)
ABO and Rh ordered.  Hospital has patient as O-; office has patient as B-.

## 2011-02-20 NOTE — Progress Notes (Signed)
"  I was here earlier, but now the UC's are way worse and closer together.  (+) FM and continue to have mucous-like bleeding."

## 2011-02-20 NOTE — Progress Notes (Signed)
Contractions have continued since yesterday, bloody show, contractions back to back.

## 2011-02-20 NOTE — H&P (Signed)
21 year old G1P0 at 43 weeks, EDC 02-27-11, admitted in labor.  Pregnancy relatively benign.  Infertility patient.  -GBS.    PE:  VS stable Abd;  FH reassuring.  Term size Cx;  By nurse in MAU- 5/90/vtx-1  Imp:  Term IUP Plan Expectant management

## 2011-02-20 NOTE — Anesthesia Procedure Notes (Signed)
Epidural Patient location during procedure: OB Start time: 02/20/2011 6:50 PM  Staffing Anesthesiologist: Eeshan Verbrugge A. Performed by: anesthesiologist   Preanesthetic Checklist Completed: patient identified, site marked, surgical consent, pre-op evaluation, timeout performed, IV checked, risks and benefits discussed and monitors and equipment checked  Epidural Patient position: sitting Prep: site prepped and draped and DuraPrep Patient monitoring: continuous pulse ox and blood pressure Approach: midline Injection technique: LOR air  Needle:  Needle type: Tuohy  Needle gauge: 17 G Needle length: 9 cm Needle insertion depth: 6 cm Catheter type: closed end flexible Catheter size: 19 Gauge Catheter at skin depth: 11 cm Test dose: negative and Other  Assessment Events: blood not aspirated, injection not painful, no injection resistance, negative IV test and no paresthesia  Additional Notes Patient identified. Risks and benefits discussed including failed block, incomplete  Pain control, post dural puncture headache, nerve damage, paralysis, blood pressure Changes, nausea, vomiting, reactions to medications-both toxic and allergic and post Partum back pain. All questions were answered. Patient expressed understanding and wished to proceed. Sterile technique was used throughout procedure. Epidural site was Dressed with sterile barrier dressing. No paresthesias, signs of intravascular injection Or signs of intrathecal spread were encountered. Attempts x 3 due to poor positioning. Patient was more comfortable after the epidural was dosed. Please see RN's note for documentation of vital signs and FHR which are stable.

## 2011-02-20 NOTE — Anesthesia Preprocedure Evaluation (Addendum)
Anesthesia Evaluation  Patient identified by MRN, date of birth, ID band Patient awake    Reviewed: Allergy & Precautions, H&P , Patient's Chart, lab work & pertinent test results  Airway Mallampati: II TM Distance: >3 FB Neck ROM: full    Dental No notable dental hx. (+) Teeth Intact   Pulmonary neg pulmonary ROS,  clear to auscultation  Pulmonary exam normal       Cardiovascular neg cardio ROS regular Normal    Neuro/Psych Negative Neurological ROS  Negative Psych ROS   GI/Hepatic negative GI ROS, Neg liver ROS,   Endo/Other  Negative Endocrine ROSMorbid obesity  Renal/GU negative Renal ROS  Genitourinary negative   Musculoskeletal negative musculoskeletal ROS (+)   Abdominal   Peds  Hematology negative hematology ROS (+)   Anesthesia Other Findings   Reproductive/Obstetrics (+) Pregnancy                          Anesthesia Physical Anesthesia Plan  ASA: II  Anesthesia Plan: Epidural   Post-op Pain Management:    Induction:   Airway Management Planned:   Additional Equipment:   Intra-op Plan:   Post-operative Plan:   Informed Consent: I have reviewed the patients History and Physical, chart, labs and discussed the procedure including the risks, benefits and alternatives for the proposed anesthesia with the patient or authorized representative who has indicated his/her understanding and acceptance.     Plan Discussed with: Surgeon and Anesthesiologist  Anesthesia Plan Comments:         Anesthesia Quick Evaluation

## 2011-02-21 LAB — CBC
MCH: 29.4 pg (ref 26.0–34.0)
MCHC: 33.6 g/dL (ref 30.0–36.0)
Platelets: 165 10*3/uL (ref 150–400)
RBC: 3.44 MIL/uL — ABNORMAL LOW (ref 3.87–5.11)

## 2011-02-21 MED ORDER — DIPHENHYDRAMINE HCL 25 MG PO CAPS: 25.0000 mg | ORAL_CAPSULE | Freq: Four times a day (QID) | ORAL | Status: DC | PRN

## 2011-02-21 MED ORDER — ZOLPIDEM TARTRATE 5 MG PO TABS: 5.0000 mg | ORAL_TABLET | Freq: Every evening | ORAL | Status: DC | PRN

## 2011-02-21 MED ORDER — SENNOSIDES-DOCUSATE SODIUM 8.6-50 MG PO TABS
2.0000 | ORAL_TABLET | Freq: Every day | ORAL | Status: DC
  Administered 2011-02-21: 2 via ORAL

## 2011-02-21 MED ORDER — OXYCODONE-ACETAMINOPHEN 5-325 MG PO TABS
1.0000 | ORAL_TABLET | ORAL | Status: DC | PRN
  Administered 2011-02-21: 2 via ORAL
  Administered 2011-02-21 (×5): 1 via ORAL
  Filled 2011-02-21 (×2): qty 1
  Filled 2011-02-21: qty 2
  Filled 2011-02-21 (×3): qty 1

## 2011-02-21 MED ORDER — SODIUM CHLORIDE 0.9 % IJ SOLN: 3.0000 mL | INTRAMUSCULAR | Status: DC | PRN

## 2011-02-21 MED ORDER — SODIUM CHLORIDE 0.9 % IV SOLN: 250.0000 mL | INTRAVENOUS | Status: DC | PRN

## 2011-02-21 MED ORDER — ONDANSETRON HCL 4 MG PO TABS: 4.0000 mg | ORAL_TABLET | ORAL | Status: DC | PRN

## 2011-02-21 MED ORDER — BENZOCAINE-MENTHOL 20-0.5 % EX AERO
1.0000 "application " | INHALATION_SPRAY | CUTANEOUS | Status: DC | PRN
  Administered 2011-02-21: 1 via TOPICAL

## 2011-02-21 MED ORDER — SIMETHICONE 80 MG PO CHEW: 80.0000 mg | CHEWABLE_TABLET | ORAL | Status: DC | PRN

## 2011-02-21 MED ORDER — PRENATAL MULTIVITAMIN CH
1.0000 | ORAL_TABLET | Freq: Every day | ORAL | Status: DC
  Administered 2011-02-21: 1 via ORAL
  Filled 2011-02-21: qty 1

## 2011-02-21 MED ORDER — IBUPROFEN 600 MG PO TABS
600.0000 mg | ORAL_TABLET | Freq: Four times a day (QID) | ORAL | Status: DC
  Administered 2011-02-21 – 2011-02-22 (×5): 600 mg via ORAL
  Filled 2011-02-21 (×5): qty 1

## 2011-02-21 MED ORDER — DIBUCAINE 1 % RE OINT: 1.0000 "application " | TOPICAL_OINTMENT | RECTAL | Status: DC | PRN

## 2011-02-21 MED ORDER — WITCH HAZEL-GLYCERIN EX PADS: 1.0000 "application " | MEDICATED_PAD | CUTANEOUS | Status: DC | PRN

## 2011-02-21 MED ORDER — ONDANSETRON HCL 4 MG/2ML IJ SOLN: 4.0000 mg | INTRAMUSCULAR | Status: DC | PRN

## 2011-02-21 MED ORDER — TETANUS-DIPHTH-ACELL PERTUSSIS 5-2.5-18.5 LF-MCG/0.5 IM SUSP: 0.5000 mL | Freq: Once | INTRAMUSCULAR | Status: DC

## 2011-02-21 MED ORDER — BENZOCAINE-MENTHOL 20-0.5 % EX AERO
INHALATION_SPRAY | CUTANEOUS | Status: AC
  Administered 2011-02-21: 1 via TOPICAL
  Filled 2011-02-21: qty 56

## 2011-02-21 MED ORDER — RHO D IMMUNE GLOBULIN 1500 UNIT/2ML IJ SOLN
300.0000 ug | Freq: Once | INTRAMUSCULAR | Status: AC
  Administered 2011-02-21: 300 ug via INTRAMUSCULAR
  Filled 2011-02-21: qty 2

## 2011-02-21 MED ORDER — LANOLIN HYDROUS EX OINT: TOPICAL_OINTMENT | CUTANEOUS | Status: DC | PRN

## 2011-02-21 MED ORDER — SODIUM CHLORIDE 0.9 % IJ SOLN: 3.0000 mL | Freq: Two times a day (BID) | INTRAMUSCULAR | Status: DC

## 2011-02-21 NOTE — Discharge Summary (Signed)
Obstetric Discharge Summary Reason for Admission: onset of labor Prenatal Procedures: none Intrapartum Procedures: spontaneous vaginal delivery Postpartum Procedures: none Complications-Operative and Postpartum: 3rd degree perineal laceration Hemoglobin  Date Value Range Status  02/21/2011 10.1* 12.0-15.0 (g/dL) Final     DELTA CHECK NOTED     REPEATED TO VERIFY     HCT  Date Value Range Status  02/21/2011 30.1* 36.0-46.0 (%) Final    Discharge Diagnoses: Term Pregnancy-delivered  Discharge Information: Date: 02/21/2011 Activity: pelvic rest Diet: routine Medications: Ibuprofen Condition: stable Instructions: refer to practice specific booklet Discharge to: home   Newborn Data: Live born female  Birth Weight: 7 lb 15.2 oz (3605 g) APGAR: 8, 9  Home with mother.  Ann Hammond A 02/21/2011, 9:41 PM

## 2011-02-21 NOTE — Anesthesia Postprocedure Evaluation (Signed)
  Anesthesia Post-op Note  Patient: Ann Hammond  Procedure(s) Performed: * No procedures listed *  Patient Location: Mother/Baby  Anesthesia Type: Epidural  Level of Consciousness: alert  and oriented  Airway and Oxygen Therapy: Patient Spontanous Breathing  Post-op Pain: mild  Post-op Assessment: Patient's Cardiovascular Status Stable and Respiratory Function Stable  Post-op Vital Signs: stable  Complications: No apparent anesthesia complications

## 2011-02-21 NOTE — Progress Notes (Signed)
Patient is eating, ambulating, voiding.  Pain control is good.  Filed Vitals:   02/21/11 0515 02/21/11 1345 02/21/11 2138 02/22/11 0550  BP: 115/72 109/69 114/74 111/75  Pulse: 105 100 106 100  Temp: 98.2 F (36.8 C) 98 F (36.7 C) 98.7 F (37.1 C) 98.5 F (36.9 C)  TempSrc:  Oral Oral Oral  Resp: 18 18 18 18   SpO2:       Fundus firm Perineum without swelling.  Lab Results  Component Value Date   WBC 11.8* 02/21/2011   HGB 10.1* 02/21/2011   HCT 30.1* 02/21/2011   MCV 87.5 02/21/2011   PLT 165 02/21/2011    --/--/B NEG (02/11 0520)/RI  A/P Post partum day 2.  Rhogam was   Routine care.  Expect d/c tomorrow.    Dezzie Badilla A

## 2011-02-21 NOTE — Progress Notes (Signed)
Post Partum Day 1 Subjective: no complaints, up ad lib, voiding and tolerating PO  Objective: Filed Vitals:   02/20/11 2301 02/21/11 0015 02/21/11 0115 02/21/11 0515  BP: 103/72 105/70 108/74 115/72  Pulse: 118 118 120 105  Temp:  98.3 F (36.8 C) 98.2 F (36.8 C) 98.2 F (36.8 C)  TempSrc:  Oral    Resp: 18 20 20 18   SpO2:        Physical Exam:  General: alert, cooperative and appears stated age Lochia: appropriate Uterine Fundus: firm   Basename 02/21/11 0500 02/20/11 1800  HGB 10.1* 12.6  HCT 30.1* 37.5    Assessment/Plan: Breastfeeding Desires circumcision, R/B/A reviewed with patient. Desires to proceed   LOS: 1 day   Mohammad Granade H. 02/21/2011, 9:42 AM

## 2011-02-22 LAB — RH IG WORKUP (INCLUDES ABO/RH): Gestational Age(Wks): 39

## 2011-03-08 ENCOUNTER — Ambulatory Visit (INDEPENDENT_AMBULATORY_CARE_PROVIDER_SITE_OTHER): Payer: 59 | Admitting: Family Medicine

## 2011-03-08 ENCOUNTER — Encounter: Payer: Self-pay | Admitting: Family Medicine

## 2011-03-08 ENCOUNTER — Telehealth: Payer: Self-pay | Admitting: *Deleted

## 2011-03-08 VITALS — BP 116/72 | HR 92 | Temp 98.5°F | Wt 203.0 lb

## 2011-03-08 DIAGNOSIS — J329 Chronic sinusitis, unspecified: Secondary | ICD-10-CM

## 2011-03-08 MED ORDER — BUDESONIDE 32 MCG/ACT NA SUSP: NASAL | Status: DC

## 2011-03-08 MED ORDER — CEFUROXIME AXETIL 500 MG PO TABS: 500.0000 mg | ORAL_TABLET | Freq: Two times a day (BID) | ORAL | Status: AC

## 2011-03-08 NOTE — Telephone Encounter (Signed)
Called and initial PA, awaiting fax.

## 2011-03-08 NOTE — Patient Instructions (Signed)

## 2011-03-08 NOTE — Telephone Encounter (Signed)
Call from pharmacy indicated that Rhinocort is a non-preferred med. Preferred med are: Flonase or nasonex. Please advise if change is ok if not PA will be needed.

## 2011-03-08 NOTE — Progress Notes (Signed)
  Subjective:     Ann Hammond is a 21 y.o. female who presents for evaluation of sinus pain. Symptoms include: congestion, headaches, nasal congestion, post nasal drip and sinus pressure. Onset of symptoms was 5 days ago. Symptoms have been gradually worsening since that time. Past history is significant for no history of pneumonia or bronchitis. Patient is a non-smoker.  Pt is breastfeeding.  The following portions of the patient's history were reviewed and updated as appropriate: allergies, current medications, past family history, past medical history, past social history, past surgical history and problem list.  Review of Systems Pertinent items are noted in HPI.   Objective:    BP 116/72  Pulse 92  Temp(Src) 98.5 F (36.9 C) (Oral)  Wt 203 lb (92.08 kg)  SpO2 99% General appearance: alert, cooperative, appears stated age and no distress Ears: normal TM's and external ear canals both ears Nose: green discharge, moderate congestion, turbinates red, swollen, sinus tenderness bilateral Throat: lips, mucosa, and tongue normal; teeth and gums normal Neck: mild anterior cervical adenopathy and thyroid not enlarged, symmetric, no tenderness/mass/nodules Lungs: clear to auscultation bilaterally Lymph nodes: Cervical adenopathy: b/l    Assessment:    Acute bacterial sinusitis.    Plan:    Nasal steroids per medication orders. Antihistamines per medication orders. Ceftin per medication orders. f/u prn

## 2011-03-08 NOTE — Telephone Encounter (Signed)
rhinocort is only one ok for breast feeding

## 2011-03-09 NOTE — Telephone Encounter (Signed)
Pa faxed back awaiting response.

## 2011-03-11 NOTE — Telephone Encounter (Signed)
Prior Auth approved 03-09-11 until 03-08-12, Pharmacy notified via fax, Left Pt detail message of approval.

## 2011-03-29 ENCOUNTER — Other Ambulatory Visit: Payer: Self-pay | Admitting: Family Medicine

## 2011-03-29 ENCOUNTER — Telehealth: Payer: Self-pay

## 2011-03-29 DIAGNOSIS — J329 Chronic sinusitis, unspecified: Secondary | ICD-10-CM

## 2011-03-29 MED ORDER — LEVOFLOXACIN 500 MG PO TABS: 500.0000 mg | ORAL_TABLET | Freq: Every day | ORAL | Status: DC

## 2011-03-29 MED ORDER — BUDESONIDE 32 MCG/ACT NA SUSP: NASAL | Status: DC

## 2011-03-29 NOTE — Telephone Encounter (Signed)
Patient was seen 03/08/11 and stated her symptoms are not better, she c/o cough with brownish sputum, nasal drainage, low grade temp of 100 degrees for the past days, sinus pressure and bilateral ear popping. She wanted to get something call into the [facility] med center. Please advise   KP

## 2011-03-29 NOTE — Telephone Encounter (Signed)
levaquin 500 mg 1 po qd for 10 days  

## 2011-03-29 NOTE — Telephone Encounter (Signed)
Patient aware Rx faxed.     KP 

## 2012-02-16 ENCOUNTER — Emergency Department (HOSPITAL_COMMUNITY)
Admission: EM | Admit: 2012-02-16 | Discharge: 2012-02-16 | Disposition: A | Discharge status: Home / Self Care | Payer: Self-pay | Source: Home / Self Care | Attending: Emergency Medicine | Admitting: Emergency Medicine

## 2012-02-16 ENCOUNTER — Encounter (HOSPITAL_COMMUNITY): Payer: Self-pay | Admitting: Emergency Medicine

## 2012-02-16 DIAGNOSIS — J3489 Other specified disorders of nose and nasal sinuses: Secondary | ICD-10-CM

## 2012-02-16 MED ORDER — SUCRALFATE 1 GM/10ML PO SUSP: 1.0000 g | Freq: Four times a day (QID) | ORAL | Status: DC

## 2012-02-16 MED ORDER — MUPIROCIN 2 % EX OINT: TOPICAL_OINTMENT | Freq: Three times a day (TID) | CUTANEOUS | Status: DC

## 2012-02-16 MED ORDER — CEPHALEXIN 500 MG PO CAPS: 500.0000 mg | ORAL_CAPSULE | Freq: Three times a day (TID) | ORAL | Status: DC

## 2012-02-16 NOTE — ED Provider Notes (Signed)
Chief Complaint  Patient presents with  . Abscess    History of Present Illness:   Ann Hammond is a 22 year old female has had a two-day history of pain in over her right nostril. It feels swollen and blocked up. She has had some swelling of the glands in her neck and also pain in her ear. She denies any fever, chills, or headache. She feels a little achy all over. She denies any nasal drainage, sneezing, or itching. She's had no sore throat cough. She has a history of allergies and frequent sinus infections. She denies any medication allergies. She takes TEFL teacher.  Review of Systems:  Other than noted above, the patient denies any of the following symptoms: Systemic:  No fevers, chills, sweats, weight loss or gain, fatigue, or tiredness. Eye:  No redness, pain, discharge, itching, blurred vision, or diplopia. ENT:  No headache, nasal congestion, sneezing, itching, epistaxis, ear pain, congestion, decreased hearing, ringing in ears, vertigo, or tinnitus.  No oral lesions, sore throat, pain on swallowing, or hoarseness. Neck:  No mass, tenderness or adenopathy. Lungs:  No coughing, wheezing, or shortness of breath. Skin:  No rash or itching.  PMFSH:  Past medical history, family history, social history, meds, and allergies were reviewed.  Physical Exam:   Vital signs:  BP 118/80  Pulse 103  Temp 98.8 F (37.1 C) (Oral)  Resp 19  SpO2 99%  Breastfeeding? No General:  Alert and oriented.  In no distress.  Skin warm and dry. Eye:  PERRL, full EOMs, lids and conjunctiva normal.   ENT:  TMs and canals clear.  Nasal mucosa not congested and without drainage.  There is a small, pale papule just inside the nose in the nasal antrum medially, this did not appear to be red or inflamed. There was tenderness to touch over the nostril externally, but no visible swelling, redness, or inflammation. Mucous membranes moist, no oral lesions, normal dentition, pharynx clear.  No cranial or  facial pain to palplation. Neck:  Supple, full ROM.  No adenopathy, tenderness or mass.  Thyroid normal. Lungs:  Breath sounds clear and equal bilaterally.  No wheezes, rales or rhonchi. Heart:  Rhythm regular, without extrasystoles.  No gallops or murmers. Skin:  Clear, warm and dry.  Assessment:  The encounter diagnosis was Nasal vestibulitis.  Plan:   1.  The following meds were prescribed:   New Prescriptions   CEPHALEXIN (KEFLEX) 500 MG CAPSULE    Take 1 capsule (500 mg total) by mouth 3 (three) times daily.   MUPIROCIN OINTMENT (BACTROBAN) 2 %    Apply topically 3 (three) times daily.   2.  The patient was instructed in symptomatic care and handouts were given. 3.  The patient was told to return if becoming worse in any way, if no better in 3 or 4 days, and given some red flag symptoms that would indicate earlier return.  Follow up:  The patient was told to follow up with Dr. Suszanne Conners in one week.       Reuben Likes, MD 02/16/12 [phone]

## 2012-02-16 NOTE — ED Notes (Signed)
Pt is here for a abscess inside right nostril since yesterday.  Painful when pressure applied Also c/o swollen glands of neck that's causing her right ear pain Denies: f/v/n/d  She is alert w/no signs of acute distress.

## 2013-02-15 ENCOUNTER — Emergency Department (INDEPENDENT_AMBULATORY_CARE_PROVIDER_SITE_OTHER)
Admission: EM | Admit: 2013-02-15 | Discharge: 2013-02-15 | Disposition: A | Discharge status: Home / Self Care | Payer: Self-pay | Source: Home / Self Care | Attending: Family Medicine | Admitting: Family Medicine

## 2013-02-15 ENCOUNTER — Encounter (HOSPITAL_COMMUNITY): Payer: Self-pay | Admitting: Emergency Medicine

## 2013-02-15 DIAGNOSIS — J069 Acute upper respiratory infection, unspecified: Secondary | ICD-10-CM

## 2013-02-15 MED ORDER — IPRATROPIUM BROMIDE 0.06 % NA SOLN: 2.0000 | Freq: Four times a day (QID) | NASAL | Status: DC

## 2013-02-15 NOTE — ED Provider Notes (Signed)
CSN: [id number]     Arrival date & time 02/15/13  1126 History   First MD Initiated Contact with Patient 02/15/13 1352     Chief Complaint  Patient presents with  . Sinusitis   (Consider location/radiation/quality/duration/timing/severity/associated sxs/prior Treatment) Patient is a 23 y.o. female presenting with sinusitis. The history is provided by the patient.  Sinusitis Pain details:    Location:  Maxillary   Quality:  Burning and pressure   Severity:  Mild   Duration:  2 weeks Progression:  Waxing and waning Ineffective treatments:  Saline sprays Associated symptoms: congestion and rhinorrhea   Associated symptoms: no ear pain, no fever, no shortness of breath, no sneezing and no sore throat     Past Medical History  Diagnosis Date  . No pertinent past medical history    Past Surgical History  Procedure Laterality Date  . Appendectomy     Family History  Problem Relation Age of Onset  . Anesthesia problems Neg Hx   . Malignant hyperthermia Neg Hx   . Pseudochol deficiency Neg Hx   . Hypotension Neg Hx    History  Substance Use Topics  . Smoking status: Former Smoker    Quit date: 03/19/2010  . Smokeless tobacco: Not on file  . Alcohol Use: No   OB History   Grav Para Term Preterm Abortions TAB SAB Ect Mult Living   1 1 1       1      Review of Systems  Constitutional: Negative.  Negative for fever.  HENT: Positive for congestion, postnasal drip and rhinorrhea. Negative for ear pain, sneezing and sore throat.   Respiratory: Negative.  Negative for shortness of breath.     Allergies  Review of patient's allergies indicates no known allergies.  Home Medications   Current Outpatient Rx  Name  Route  Sig  Dispense  Refill  . acetaminophen (TYLENOL) 500 MG tablet   Oral   Take 1,000 mg by mouth every 6 (six) hours as needed. For pain         . budesonide (RHINOCORT AQUA) 32 MCG/ACT nasal spray      1 spray each nostril qd   1 Bottle   2   .  cephALEXin (KEFLEX) 500 MG capsule   Oral   Take 1 capsule (500 mg total) by mouth 3 (three) times daily.   30 capsule   0   . diphenhydrAMINE (BENADRYL) 25 MG tablet   Oral   Take 25 mg by mouth every 6 (six) hours as needed. For allergies/sleep         . ipratropium (ATROVENT) 0.06 % nasal spray   Each Nare   Place 2 sprays into both nostrils 4 (four) times daily.   15 mL   1   . EXPIRED: levofloxacin (LEVAQUIN) 500 MG tablet   Oral   Take 1 tablet (500 mg total) by mouth daily.   10 tablet   0   . EXPIRED: levofloxacin (LEVAQUIN) 500 MG tablet   Oral   Take 1 tablet (500 mg total) by mouth daily.   10 tablet   0   . mupirocin ointment (BACTROBAN) 2 %   Topical   Apply topically 3 (three) times daily.   22 g   0   . Prenatal Vit-Fe Fumarate-FA (PRENATAL MULTIVITAMIN) TABS   Oral   Take 1 tablet by mouth daily.          BP 118/75  Pulse 92  Temp(Src) 98.1  F (36.7 C) (Oral)  Resp 16  SpO2 100%  LMP 01/27/2013 Physical Exam  Nursing note and vitals reviewed. Constitutional: She is oriented to person, place, and time. She appears well-developed and well-nourished.  HENT:  Head: Macrocephalic.  Right Ear: External ear normal.  Left Ear: External ear normal.  Nose: Mucosal edema and rhinorrhea present.  Mouth/Throat: Oropharynx is clear and moist.  Eyes: Conjunctivae are normal. Pupils are equal, round, and reactive to light.  Neck: Normal range of motion. Neck supple.  Lymphadenopathy:    She has no cervical adenopathy.  Neurological: She is alert and oriented to person, place, and time.  Skin: Skin is warm.    ED Course  Procedures (including critical care time) Labs Review Labs Reviewed - No data to display Imaging Review No results found.    MDM      Linna HoffJames D Brettney Ficken, MD 02/15/13 [phone]

## 2013-02-15 NOTE — ED Notes (Signed)
C/o sinus problems States she has blisters in her right nostril States right side of neck gland hurts with radiating pain to right ear Has used saline nasal spray and ibuprofen but no relief.

## 2013-11-11 ENCOUNTER — Encounter (HOSPITAL_COMMUNITY): Payer: Self-pay | Admitting: Emergency Medicine

## 2017-11-17 ENCOUNTER — Ambulatory Visit: Payer: Self-pay | Admitting: Nurse Practitioner

## 2017-11-17 VITALS — BP 116/70 | HR 100 | Temp 98.9°F | Resp 20 | Wt 217.2 lb

## 2017-11-17 DIAGNOSIS — J019 Acute sinusitis, unspecified: Secondary | ICD-10-CM

## 2017-11-17 MED ORDER — TRIAMCINOLONE ACETONIDE 55 MCG/ACT NA AERO: 2.0000 | INHALATION_SPRAY | Freq: Every day | NASAL | 0 refills | Status: DC

## 2017-11-17 MED ORDER — AMOXICILLIN-POT CLAVULANATE 875-125 MG PO TABS: 1.0000 | ORAL_TABLET | Freq: Two times a day (BID) | ORAL | 0 refills | Status: AC

## 2017-11-17 NOTE — Progress Notes (Signed)
Subjective:  Ann Hammond is a 27 y.o. female who presents for evaluation of possible sinusitis.  Symptoms include right ear pressure/pain, facial pain, nasal congestion, post nasal drip, sinus pressure, sinus pain and sore throat.  Onset of symptoms was 1.5 weeks ago, and has been gradually worsening since that time.  Treatment to date:  antihistamines and ibuprofen.  High risk factors for influenza complications:  none.  The patient informed she does have a history of recurrent sinusitis.  She states that her symptoms feel like previous sinus infections from before.  The following portions of the patient's history were reviewed and updated as appropriate:  allergies, current medications and past medical history.  Constitutional: positive for fatigue, negative for anorexia, chills, fevers and malaise Eyes: negative Ears, nose, mouth, throat, and face: positive for nasal congestion, sore throat and right ear pressure, right-side pain that feels like a "toothache", negative for ear drainage, earaches and hoarseness Respiratory: positive for cough, negative for asthma, sputum, stridor and wheezing Cardiovascular: negative Gastrointestinal: negative Neurological: positive for headaches, negative for coordination problems, dizziness, paresthesia, vertigo and weakness Allergic/Immunologic: positive for hay fever Objective:  BP 116/70 (BP Location: Right Arm, Patient Position: Sitting, Cuff Size: Normal)   Pulse 100   Temp 98.9 F (37.2 C) (Oral)   Resp 20   Wt 217 lb 3.2 oz (98.5 kg)   SpO2 99%   BMI 35.59 kg/m  General appearance: alert, cooperative, fatigued and no distress Head: Normocephalic, without obvious abnormality, atraumatic Eyes: conjunctivae/corneas clear. PERRL, EOM's intact. Fundi benign. Ears: normal TM and external ear canal left ear and abnormal TM right ear - mucoid middle ear fluid Nose: no discharge, mild congestion, moderate maxillary sinus tenderness right, no frontal  sinus tenderness bilateral Throat: lips, mucosa, and tongue normal; teeth and gums normal Lungs: clear to auscultation bilaterally Heart: regular rate and rhythm, S1, S2 normal, no murmur, click, rub or gallop Abdomen: soft, non-tender; bowel sounds normal; no masses,  no organomegaly Pulses: 2+ and symmetric Skin: Skin color, texture, turgor normal. No rashes or lesions Lymph nodes: cervical and submandibular nodes normal Neurologic: Grossly normal    Assessment:   Acute Sinusitis      Plan:   Exam findings, diagnosis etiology and medication use and indications reviewed with patient. Follow- Up and discharge instructions provided. No emergent/urgent issues found on exam. Patient education was provided. Patient verbalized understanding of information provided and agrees with plan of care (POC), all questions answered. The patient is advised to call or return to clinic if condition does not see an improvement in symptoms, or to seek the care of the closest emergency department if condition worsens with the above plan.   1. Acute sinusitis, recurrence not specified, unspecified location  - triamcinolone (NASACORT) 55 MCG/ACT AERO nasal inhaler; Place 2 sprays into the nose daily for 10 days.  Dispense: 16.9 mL; Refill: 0 - amoxicillin-clavulanate (AUGMENTIN) 875-125 MG tablet; Take 1 tablet by mouth 2 (two) times daily for 10 days.  Dispense: 20 tablet; Refill: 0 -Take medication as prescribed. -Ibuprofen or Tylenol for pain, fever, or general discomfort. -Increase fluids. -Sleep elevated on at least 2 pillows at bedtime to help with cough. -Use a humidifier or vaporizer when at home and during sleep. -May use a teaspoon of honey or over-the-counter cough drops to help with cough. -Take Benadryl 25mg  at bedtime until symptoms improve. -Use a warm compress to the right ear for pain or discomfort. -May use normal saline nasal spray to help  with nasal congestion throughout the  day. -Follow-up if symptoms do not improve.

## 2017-11-17 NOTE — Patient Instructions (Signed)
Sinusitis, Adult -Take medication as prescribed. -Ibuprofen or Tylenol for pain, fever, or general discomfort. -Increase fluids. -Sleep elevated on at least 2 pillows at bedtime to help with cough. -Use a humidifier or vaporizer when at home and during sleep. -May use a teaspoon of honey or over-the-counter cough drops to help with cough. -Use a warm compress to the right ear for pain or discomfort. -May use normal saline nasal spray to help with nasal congestion throughout the day. -Follow-up if symptoms do not improve.  Sinusitis is soreness and inflammation of your sinuses. Sinuses are hollow spaces in the bones around your face. Your sinuses are located:  Around your eyes.  In the middle of your forehead.  Behind your nose.  In your cheekbones.  Your sinuses and nasal passages are lined with a stringy fluid (mucus). Mucus normally drains out of your sinuses. When your nasal tissues become inflamed or swollen, the mucus can become trapped or blocked so air cannot flow through your sinuses. This allows bacteria, viruses, and funguses to grow, which leads to infection. Sinusitis can develop quickly and last for 7?10 days (acute) or for more than 12 weeks (chronic). Sinusitis often develops after a cold. What are the causes? This condition is caused by anything that creates swelling in the sinuses or stops mucus from draining, including:  Allergies.  Asthma.  Bacterial or viral infection.  Abnormally shaped bones between the nasal passages.  Nasal growths that contain mucus (nasal polyps).  Narrow sinus openings.  Pollutants, such as chemicals or irritants in the air.  A foreign object stuck in the nose.  A fungal infection. This is rare.  What increases the risk? The following factors may make you more likely to develop this condition:  Having allergies or asthma.  Having had a recent cold or respiratory tract infection.  Having structural deformities or blockages  in your nose or sinuses.  Having a weak immune system.  Doing a lot of swimming or diving.  Overusing nasal sprays.  Smoking.  What are the signs or symptoms? The main symptoms of this condition are pain and a feeling of pressure around the affected sinuses. Other symptoms include:  Upper toothache.  Earache.  Headache.  Bad breath.  Decreased sense of smell and taste.  A cough that may get worse at night.  Fatigue.  Fever.  Thick drainage from your nose. The drainage is often green and it may contain pus (purulent).  Stuffy nose or congestion.  Postnasal drip. This is when extra mucus collects in the throat or back of the nose.  Swelling and warmth over the affected sinuses.  Sore throat.  Sensitivity to light.  How is this diagnosed? This condition is diagnosed based on symptoms, a medical history, and a physical exam. To find out if your condition is acute or chronic, your health care provider may:  Look in your nose for signs of nasal polyps.  Tap over the affected sinus to check for signs of infection.  View the inside of your sinuses using an imaging device that has a light attached (endoscope).  If your health care provider suspects that you have chronic sinusitis, you may also:  Be tested for allergies.  Have a sample of mucus taken from your nose (nasal culture) and checked for bacteria.  Have a mucus sample examined to see if your sinusitis is related to an allergy.  If your sinusitis does not respond to treatment and it lasts longer than 8 weeks, you may  have an MRI or CT scan to check your sinuses. These scans also help to determine how severe your infection is. In rare cases, a bone biopsy may be done to rule out more serious types of fungal sinus disease. How is this treated? Treatment for sinusitis depends on the cause and whether your condition is chronic or acute. If a virus is causing your sinusitis, your symptoms will go away on their  own within 10 days. You may be given medicines to relieve your symptoms, including:  Topical nasal decongestants. They shrink swollen nasal passages and let mucus drain from your sinuses.  Antihistamines. These drugs block inflammation that is triggered by allergies. This can help to ease swelling in your nose and sinuses.  Topical nasal corticosteroids. These are nasal sprays that ease inflammation and swelling in your nose and sinuses.  Nasal saline washes. These rinses can help to get rid of thick mucus in your nose.  If your condition is caused by bacteria, you will be given an antibiotic medicine. If your condition is caused by a fungus, you will be given an antifungal medicine. Surgery may be needed to correct underlying conditions, such as narrow nasal passages. Surgery may also be needed to remove polyps. Follow these instructions at home: Medicines  Take, use, or apply over-the-counter and prescription medicines only as told by your health care provider. These may include nasal sprays.  If you were prescribed an antibiotic medicine, take it as told by your health care provider. Do not stop taking the antibiotic even if you start to feel better. Hydrate and Humidify  Drink enough water to keep your urine clear or pale yellow. Staying hydrated will help to thin your mucus.  Use a cool mist humidifier to keep the humidity level in your home above 50%.  Inhale steam for 10-15 minutes, 3-4 times a day or as told by your health care provider. You can do this in the bathroom while a hot shower is running.  Limit your exposure to cool or dry air. Rest  Rest as much as possible.  Sleep with your head raised (elevated).  Make sure to get enough sleep each night. General instructions  Apply a warm, moist washcloth to your face 3-4 times a day or as told by your health care provider. This will help with discomfort.  Wash your hands often with soap and water to reduce your exposure  to viruses and other germs. If soap and water are not available, use hand sanitizer.  Do not smoke. Avoid being around people who are smoking (secondhand smoke).  Keep all follow-up visits as told by your health care provider. This is important. Contact a health care provider if:  You have a fever.  Your symptoms get worse.  Your symptoms do not improve within 10 days. Get help right away if:  You have a severe headache.  You have persistent vomiting.  You have pain or swelling around your face or eyes.  You have vision problems.  You develop confusion.  Your neck is stiff.  You have trouble breathing. This information is not intended to replace advice given to you by your health care provider. Make sure you discuss any questions you have with your health care provider. Document Released: 12/27/2004 Document Revised: 08/23/2015 Document Reviewed: 10/22/2014 Elsevier Interactive Patient Education  [employer].

## 2017-12-09 ENCOUNTER — Telehealth: Payer: Self-pay | Admitting: Family Medicine

## 2017-12-09 NOTE — Telephone Encounter (Signed)
Patient called reporting that symptoms have not resolved and is requesting additional round of antibiotics. Patient advised to make a follow up appointment for re-evaluation due to the duration/persistance of symptoms. Patient verbalized understanding of information.

## 2019-02-01 ENCOUNTER — Ambulatory Visit: Payer: Self-pay | Attending: Internal Medicine

## 2019-02-01 DIAGNOSIS — Z20822 Contact with and (suspected) exposure to covid-19: Secondary | ICD-10-CM | POA: Insufficient documentation

## 2019-02-02 ENCOUNTER — Telehealth: Payer: Self-pay | Admitting: *Deleted

## 2019-02-02 LAB — NOVEL CORONAVIRUS, NAA: SARS-CoV-2, NAA: NOT DETECTED

## 2019-02-02 NOTE — Telephone Encounter (Signed)
Patient is calling for COVID result- notified pending result.

## 2019-03-09 ENCOUNTER — Emergency Department (HOSPITAL_BASED_OUTPATIENT_CLINIC_OR_DEPARTMENT_OTHER)
Admission: EM | Admit: 2019-03-09 | Discharge: 2019-03-09 | Disposition: A | Discharge status: Home / Self Care | Payer: Self-pay | Attending: Emergency Medicine | Admitting: Emergency Medicine

## 2019-03-09 ENCOUNTER — Encounter (HOSPITAL_BASED_OUTPATIENT_CLINIC_OR_DEPARTMENT_OTHER): Payer: Self-pay | Admitting: Emergency Medicine

## 2019-03-09 DIAGNOSIS — M940 Chondrocostal junction syndrome [Tietze]: Secondary | ICD-10-CM | POA: Insufficient documentation

## 2019-03-09 DIAGNOSIS — Z87891 Personal history of nicotine dependence: Secondary | ICD-10-CM | POA: Insufficient documentation

## 2019-03-09 MED ORDER — KETOROLAC TROMETHAMINE 15 MG/ML IJ SOLN
15.0000 mg | Freq: Once | INTRAMUSCULAR | Status: DC
  Filled 2019-03-09: qty 1

## 2019-03-09 MED ORDER — KETOROLAC TROMETHAMINE 15 MG/ML IJ SOLN
15.0000 mg | Freq: Once | INTRAMUSCULAR | Status: AC
  Administered 2019-03-09: 15 mg via INTRAMUSCULAR

## 2019-03-09 MED ORDER — MELOXICAM 15 MG PO TABS: ORAL_TABLET | ORAL | 0 refills | Status: DC

## 2019-03-09 NOTE — ED Triage Notes (Signed)
Pt states she was putting her son to bed and had a sharp sudden pain in her left chest  Pt states the pain has been constant since then  Pt states it feels better to sit up but it does not go away  Pt states she took ibuprofen and put a heating pad on it without relief  States pain started around 10 pm

## 2019-03-09 NOTE — ED Provider Notes (Signed)
[facility] Provider Note: Ann Spurling, MD, FACEP  CSN: [id number] MRN: [id number] ARRIVAL: 03/09/19 at [place]: [place]  Chest Pain   HISTORY OF PRESENT ILLNESS  03/09/19 1:48 AM Ann Hammond is a 29 y.o. female who leaned over yesterday evening about 10 PM and felt a sudden pain in the left parasternal region.  The pain is fairly well localized, sharp and is rated as a 7 out of 10.  It is worse with movement, particularly rotation of her upper torso.  It is improved with sitting up and worse with lying down.  There is no associated cough, shortness of breath, fever, nausea or vomiting.  She took 800 mg of ibuprofen about 10 PM without significant relief.    Past Medical History:  Diagnosis Date  . No pertinent past medical history     Past Surgical History:  Procedure Laterality Date  . APPENDECTOMY      Family History  Problem Relation Age of Onset  . Hypertension Father   . CAD Father   . Diabetes Other   . Anesthesia problems Neg Hx   . Malignant hyperthermia Neg Hx   . Pseudochol deficiency Neg Hx   . Hypotension Neg Hx     Social History   Tobacco Use  . Smoking status: Former Smoker    Quit date: 03/19/2010    Years since quitting: 8.9  . Smokeless tobacco: Never Used  Substance Use Topics  . Alcohol use: Yes    Comment: occ  . Drug use: No    Prior to Admission medications   Medication Sig Start Date End Date Taking? Authorizing Provider  acetaminophen (TYLENOL) 500 MG tablet Take 1,000 mg by mouth every 6 (six) hours as needed. For pain    [provider]  diphenhydrAMINE (BENADRYL) 25 MG tablet Take 25 mg by mouth every 6 (six) hours as needed. For allergies/sleep    [provider]  meloxicam (MOBIC) 15 MG tablet Take 1 tablet once daily as needed for chest wall pain. 03/09/19   Kalicia Dufresne, MD  Prenatal Vit-Fe Fumarate-FA (PRENATAL MULTIVITAMIN) TABS Take 1 tablet by mouth daily.    [provider]  triamcinolone (NASACORT) 55 MCG/ACT AERO nasal inhaler Place 2 sprays into the nose daily for 10 days. 11/17/17 11/27/17  Kara Dies, NP  budesonide (RHINOCORT AQUA) 32 MCG/ACT nasal spray 1 spray each nostril qd Patient not taking: Reported on 11/17/2017 03/29/11 03/09/19  Roma Schanz R, DO  ipratropium (ATROVENT) 0.06 % nasal spray Place 2 sprays into both nostrils 4 (four) times daily. Patient not taking: Reported on 11/17/2017 02/15/13 03/09/19  Billy Fischer, MD  levofloxacin (LEVAQUIN) 500 MG tablet Take 1 tablet (500 mg total) by mouth daily. 03/29/11 03/09/19  Ann Held, DO    Allergies Patient has no known allergies.   REVIEW OF SYSTEMS  Negative except as noted here or in the History of Present Illness.   PHYSICAL EXAMINATION  Initial Vital Signs Blood pressure (!) 148/101, pulse (!) 104, temperature 98.1 F (36.7 C), temperature source Oral, resp. rate 18, height 5\' 6"  (1.676 m), weight 99.8 kg, last menstrual period 03/01/2019, SpO2 100 %.  Examination General: Well-developed, well-nourished female in no acute distress; appearance consistent with age of record HENT: normocephalic; atraumatic Eyes: Normal appearance Neck: supple Heart: regular rate and rhythm Lungs: clear to auscultation bilaterally Chest: Well localized left parasternal tenderness Abdomen: soft; nondistended; nontender; bowel sounds  present Extremities: No deformity; full range of motion Neurologic: Awake, alert and oriented; motor function intact in all extremities and symmetric; no facial droop Skin: Warm and dry Psychiatric: Normal mood and affect   RESULTS  Summary of this visit's results, reviewed and interpreted by myself:   EKG Interpretation  Date/Time:    Ventricular Rate:    PR Interval:    QRS Duration:   QT Interval:    QTC Calculation:   R Axis:     Text Interpretation:        Laboratory Studies: No results found for this or any  previous visit (from the past 24 hour(s)). Imaging Studies: No results found.  ED COURSE and MDM  Nursing notes, initial and subsequent vitals signs, including pulse oximetry, reviewed and interpreted by myself.  Vitals:   03/09/19 0137 03/09/19 0140  BP:  (!) 148/101  Pulse:  (!) 104  Resp:  18  Temp:  98.1 F (36.7 C)  TempSrc:  Oral  SpO2:  100%  Weight: 99.8 kg   Height: 5\' 6"  (1.676 m)    Medications  ketorolac (TORADOL) 15 MG/ML injection 15 mg (15 mg Intramuscular Given 03/09/19 0154)   2:32 AM Significant relief with IM Toradol.  History and exam consistent with costochondritis.  Will place her on Mobic.   PROCEDURES  Procedures   ED DIAGNOSES     ICD-10-CM   1. Costochondritis  M94.0        Yliana Gravois, 03/11/19, MD 03/09/19 [phone]

## 2020-07-20 DIAGNOSIS — E282 Polycystic ovarian syndrome: Secondary | ICD-10-CM

## 2020-07-20 HISTORY — DX: Polycystic ovarian syndrome: E28.2

## 2020-07-22 DIAGNOSIS — E038 Other specified hypothyroidism: Secondary | ICD-10-CM | POA: Insufficient documentation

## 2021-06-23 ENCOUNTER — Telehealth (INDEPENDENT_AMBULATORY_CARE_PROVIDER_SITE_OTHER): Payer: Self-pay

## 2021-06-23 DIAGNOSIS — Z3491 Encounter for supervision of normal pregnancy, unspecified, first trimester: Secondary | ICD-10-CM

## 2021-06-23 DIAGNOSIS — Z3490 Encounter for supervision of normal pregnancy, unspecified, unspecified trimester: Secondary | ICD-10-CM | POA: Insufficient documentation

## 2021-06-23 DIAGNOSIS — E282 Polycystic ovarian syndrome: Secondary | ICD-10-CM | POA: Insufficient documentation

## 2021-06-23 DIAGNOSIS — Z3A00 Weeks of gestation of pregnancy not specified: Secondary | ICD-10-CM

## 2021-06-23 DIAGNOSIS — Z3480 Encounter for supervision of other normal pregnancy, unspecified trimester: Secondary | ICD-10-CM

## 2021-06-23 DIAGNOSIS — E039 Hypothyroidism, unspecified: Secondary | ICD-10-CM

## 2021-06-23 MED ORDER — GOJJI WEIGHT SCALE MISC: 1.0000 | 0 refills | Status: DC | PRN

## 2021-06-23 MED ORDER — BLOOD PRESSURE KIT DEVI: 1.0000 | 0 refills | Status: DC | PRN

## 2021-06-23 NOTE — Progress Notes (Signed)
New OB Intake  I connected with  Ann Hammond on 06/23/21 at 10:15 AM EDT by MyChart Video Visit and verified that I am speaking with the correct person using two identifiers. Nurse is located at [facility] and pt is located at home.  I discussed the limitations, risks, security and privacy concerns of performing an evaluation and management service by telephone and the availability of in person appointments. I also discussed with the patient that there may be a patient responsible charge related to this service. The patient expressed understanding and agreed to proceed.  I explained I am completing New OB Intake today. We discussed her EDD of 01/25/2022 that is based on LMP of 04/20/2021. Pt is G2/P1. I reviewed her allergies, medications, Medical/Surgical/OB history, and appropriate screenings. I informed her of [facility] services. Based on history, this is a/an  pregnancy uncomplicated .   Patient Active Problem List   Diagnosis Date Noted   Hypothyroidism 06/23/2021   PCOS (polycystic ovarian syndrome) 06/23/2021   Supervision of low-risk pregnancy 06/23/2021    Concerns addressed today  Delivery Plans:  Plans to deliver at [facility].   MyChart/Babyscripts MyChart access verified. I explained pt will have some visits in office and some virtually. Babyscripts instructions given and order placed. Patient verifies receipt of registration text/e-mail. Account successfully created and app downloaded.  Blood Pressure Cuff  Blood pressure cuff ordered for patient to pick-up from [employer]. Explained after first prenatal appt pt will check weekly and document in Babyscripts.  Weight scale: Patient does not  have weight scale. Weight scale ordered for patient to pick up from [employer].   Anatomy US Explained first scheduled [place] will be around 19 weeks. Anatomy US scheduled for 08/31/2021 at 10:30AM. Pt notified to arrive at 10:15AM.  Labs Discussed Natera genetic screening with  patient. Would like both Panorama and Horizon drawn at new OB visit.Also if interested in genetic testing, tell patient she will need AFP 15-21 weeks to complete genetic testing .Routine prenatal labs needed.  Covid Vaccine Patient has covid vaccine.   Is patient a CenteringPregnancy candidate?  Declined Declined due to Support Person Concern  Is patient a Mom+Baby Combined Care candidate?  Not a candidate    Is patient interested in [place]?  No   Informed patient of [facility] website  and placed link in her AVS.   Social Determinants of Health Food Insecurity: Patient denies food insecurity. WIC Referral: Patient is interested in referral to [facility].  Transportation: Patient denies transportation needs. Childcare: Discussed no children allowed at ultrasound appointments. Offered childcare services; patient declines childcare services at this time.  Send link to Pregnancy Navigators   Placed OB Box on problem list and updated  First visit review I reviewed new OB appt with pt. I explained she will have a pelvic exam, ob bloodwork with genetic screening, and PAP smear. Explained pt will be seen by Tinnie Gens MD at first visit; encounter routed to appropriate provider. Explained that patient will be seen by pregnancy navigator following visit with provider. [facility] information placed in AVS.   Vidal Schwalbe, CMA 06/23/2021  10:36 AM

## 2021-07-05 ENCOUNTER — Encounter: Payer: Self-pay | Admitting: Family Medicine

## 2021-07-12 ENCOUNTER — Encounter: Payer: Self-pay | Admitting: Family Medicine

## 2021-07-12 ENCOUNTER — Other Ambulatory Visit: Payer: Self-pay

## 2021-07-12 ENCOUNTER — Ambulatory Visit (INDEPENDENT_AMBULATORY_CARE_PROVIDER_SITE_OTHER): Payer: Medicaid Other | Admitting: Family Medicine

## 2021-07-12 ENCOUNTER — Other Ambulatory Visit (HOSPITAL_COMMUNITY)
Admission: RE | Admit: 2021-07-12 | Discharge: 2021-07-12 | Disposition: A | Discharge status: Home / Self Care | Payer: Medicaid Other | Source: Ambulatory Visit | Attending: Family Medicine | Admitting: Family Medicine

## 2021-07-12 VITALS — BP 113/77 | HR 99 | Wt 220.8 lb

## 2021-07-12 DIAGNOSIS — O26899 Other specified pregnancy related conditions, unspecified trimester: Secondary | ICD-10-CM | POA: Diagnosis not present

## 2021-07-12 DIAGNOSIS — Z3490 Encounter for supervision of normal pregnancy, unspecified, unspecified trimester: Secondary | ICD-10-CM | POA: Insufficient documentation

## 2021-07-12 DIAGNOSIS — Z6791 Unspecified blood type, Rh negative: Secondary | ICD-10-CM

## 2021-07-12 DIAGNOSIS — Z3A12 12 weeks gestation of pregnancy: Secondary | ICD-10-CM

## 2021-07-12 DIAGNOSIS — O09899 Supervision of other high risk pregnancies, unspecified trimester: Secondary | ICD-10-CM

## 2021-07-12 DIAGNOSIS — Z2839 Other underimmunization status: Secondary | ICD-10-CM

## 2021-07-12 DIAGNOSIS — E039 Hypothyroidism, unspecified: Secondary | ICD-10-CM | POA: Diagnosis not present

## 2021-07-12 HISTORY — DX: Other specified pregnancy related conditions, unspecified trimester: O26.899

## 2021-07-12 HISTORY — DX: Unspecified blood type, rh negative: Z67.91

## 2021-07-12 LAB — POCT URINALYSIS DIP (DEVICE)
Bilirubin Urine: NEGATIVE
Glucose, UA: NEGATIVE mg/dL
Hgb urine dipstick: NEGATIVE
Ketones, ur: NEGATIVE mg/dL
Leukocytes,Ua: NEGATIVE
Nitrite: NEGATIVE
Protein, ur: NEGATIVE mg/dL
Specific Gravity, Urine: 1.02 (ref 1.005–1.030)
Urobilinogen, UA: 0.2 mg/dL (ref 0.0–1.0)
pH: 6 (ref 5.0–8.0)

## 2021-07-12 MED ORDER — BLOOD PRESSURE KIT DEVI: 1.0000 | Freq: Once | 0 refills | Status: AC

## 2021-07-13 LAB — CBC/D/PLT+RPR+RH+ABO+RUBIGG...
Antibody Screen: NEGATIVE
Basophils Absolute: 0 10*3/uL (ref 0.0–0.2)
Basos: 0 %
EOS (ABSOLUTE): 0.1 10*3/uL (ref 0.0–0.4)
Eos: 1 %
HCV Ab: NONREACTIVE
HIV Screen 4th Generation wRfx: NONREACTIVE
Hematocrit: 38.3 % (ref 34.0–46.6)
Hemoglobin: 13.1 g/dL (ref 11.1–15.9)
Hepatitis B Surface Ag: NEGATIVE
Immature Grans (Abs): 0 10*3/uL (ref 0.0–0.1)
Immature Granulocytes: 0 %
Lymphocytes Absolute: 2.3 10*3/uL (ref 0.7–3.1)
Lymphs: 23 %
MCH: 29.1 pg (ref 26.6–33.0)
MCHC: 34.2 g/dL (ref 31.5–35.7)
MCV: 85 fL (ref 79–97)
Monocytes Absolute: 0.5 10*3/uL (ref 0.1–0.9)
Monocytes: 5 %
Neutrophils Absolute: 6.8 10*3/uL (ref 1.4–7.0)
Neutrophils: 71 %
Platelets: 287 10*3/uL (ref 150–450)
RBC: 4.5 x10E6/uL (ref 3.77–5.28)
RDW: 12.3 % (ref 11.7–15.4)
RPR Ser Ql: NONREACTIVE
Rh Factor: NEGATIVE
Rubella Antibodies, IGG: <0.9 index — ABNORMAL LOW (ref >0.99)
WBC: 9.8 10*3/uL (ref 3.4–10.8)

## 2021-07-13 LAB — HEMOGLOBIN A1C
Est. average glucose Bld gHb Est-mCnc: 105 mg/dL
Hgb A1c MFr Bld: 5.3 % (ref 4.8–5.6)

## 2021-07-13 LAB — TSH: TSH: 2.07 u[IU]/mL (ref 0.450–4.500)

## 2021-07-13 LAB — HCV INTERPRETATION

## 2021-07-14 DIAGNOSIS — Z2839 Other underimmunization status: Secondary | ICD-10-CM | POA: Insufficient documentation

## 2021-07-14 LAB — URINE CULTURE, OB REFLEX: Organism ID, Bacteria: NO GROWTH

## 2021-07-14 LAB — CULTURE, OB URINE

## 2021-07-14 NOTE — Progress Notes (Signed)
Subjective:   Ann Hammond is a 31 y.o. G2P1001 at 97w1dby LMP being seen today for her first obstetrical visit.  Her obstetrical history is significant for obesity. Patient does intend to breast feed. Pregnancy history fully reviewed.  Patient reports no complaints.  HISTORY: OB History  Gravida Para Term Preterm AB Living  2 1 1  0 0 1  SAB IAB Ectopic Multiple Live Births  0 0 0 0 1    # Outcome Date GA Lbr Len/2nd Weight Sex Delivery Anes PTL Lv  2 Current           1 Term 02/20/11 [id number] / 00:40 7 lb 15.2 oz (3.605 kg) M Vag-Spont EPI  LIV     Birth Comments: WDL     Name: Ann Hammond   Apgar1: 8  Apgar5: 9   Last pap smear was  2022 and was normal at P4[facility]--records requested Past Medical History:  Diagnosis Date   Hypothyroidism    No pertinent past medical history    Past Surgical History:  Procedure Laterality Date   APPENDECTOMY     Family History  Problem Relation Age of Onset   Hypertension Father    CAD Father    Hyperlipidemia Maternal Grandmother    Diabetes Maternal Grandmother    Lung cancer Maternal Grandfather    Pancreatic cancer Paternal Grandmother    Diabetes Other    Anesthesia problems Neg Hx    Malignant hyperthermia Neg Hx    Pseudochol deficiency Neg Hx    Hypotension Neg Hx    Social History   Tobacco Use   Smoking status: Former    Types: Cigarettes    Quit date: 03/19/2010    Years since quitting: 11.3   Smokeless tobacco: Never  Vaping Use   Vaping Use: Never used  Substance Use Topics   Alcohol use: Yes    Comment: occ   Drug use: No   No Known Allergies Current Outpatient Medications on File Prior to Visit  Medication Sig Dispense Refill   levothyroxine (SYNTHROID) 25 MCG tablet Take 25 mcg by mouth daily before breakfast.     Misc. Devices (GOJJI WEIGHT SCALE) MISC 1 Device by Does not apply route as needed. 1 each 0   Prenatal Vit-Fe Fumarate-FA (PRENATAL MULTIVITAMIN) TABS Take 1 tablet by mouth  daily.     Psyllium (METAMUCIL PO) Take by mouth.     acetaminophen (TYLENOL) 500 MG tablet Take 1,000 mg by mouth every 6 (six) hours as needed. For pain (Patient not taking: Reported on 07/12/2021)     [DISCONTINUED] budesonide (RHINOCORT AQUA) 32 MCG/ACT nasal spray 1 spray each nostril qd (Patient not taking: Reported on 11/17/2017) 1 Bottle 2   [DISCONTINUED] ipratropium (ATROVENT) 0.06 % nasal spray Place 2 sprays into both nostrils 4 (four) times daily. (Patient not taking: Reported on 11/17/2017) 15 mL 1   [DISCONTINUED] levofloxacin (LEVAQUIN) 500 MG tablet Take 1 tablet (500 mg total) by mouth daily. 10 tablet 0   No current facility-administered medications on file prior to visit.     Exam   Vitals:   07/12/21 1546  BP: 113/77  Pulse: 99  Weight: 220 lb 12.8 oz (100.2 kg)   Fetal Heart Rate (bpm): 168  System: General: well-developed, well-nourished female in no acute distress   Skin: normal coloration and turgor, no rashes   Neurologic: oriented, normal, negative, normal mood   Extremities: normal strength, tone, and muscle mass, ROM  of all joints is normal   HEENT PERRLA, extraocular movement intact and sclera clear, anicteric   Mouth/Teeth mucous membranes moist, pharynx normal without lesions and dental hygiene good   Neck supple and no masses   Cardiovascular: regular rate and rhythm   Respiratory:  no respiratory distress, normal breath sounds   Abdomen: soft, non-tender; bowel sounds normal; no masses,  no organomegaly     Assessment:   Pregnancy: G2P1001 Patient Active Problem List   Diagnosis Date Noted   Rh negative state in antepartum period 07/12/2021   Hypothyroidism 06/23/2021   PCOS (polycystic ovarian syndrome) 06/23/2021   Supervision of low-risk pregnancy 06/23/2021     Plan:  1. Encounter for supervision of low-risk pregnancy, antepartum New OB labs - HORIZON CUSTOM - Panorama Prenatal Test Full Panel - CBC/D/Plt+RPR+Rh+ABO+RubIgG... - HgB  A1c - Culture, OB Urine - Blood Pressure Monitoring (BLOOD PRESSURE KIT) DEVI; 1 Device by Does not apply route once for 1 dose.  Dispense: 1 each; Refill: 0 - GC/Chlamydia probe amp ([place])not at [facility]  2. Hypothyroidism, unspecified type - TSH  3. Rh negative state in antepartum period Will need Rhogam  4. H/o fibular hemimelia in brother  Initial labs drawn. Continue prenatal vitamins. Genetic Screening discussed, NIPS: ordered. Ultrasound discussed; fetal anatomic survey: ordered. Problem list reviewed and updated. The nature of Ann Hammond with multiple MDs and other Advanced Practice Providers was explained to patient; also emphasized that residents, students are part of our team. Routine obstetric precautions reviewed. Return in about 4 weeks (around 08/09/2021) for [facility].

## 2021-07-15 LAB — GC/CHLAMYDIA PROBE AMP (~~LOC~~) NOT AT ARMC
Chlamydia: NEGATIVE
Comment: NEGATIVE
Comment: NORMAL
Neisseria Gonorrhea: NEGATIVE

## 2021-07-19 ENCOUNTER — Encounter: Payer: Self-pay | Admitting: *Deleted

## 2021-08-12 ENCOUNTER — Ambulatory Visit (INDEPENDENT_AMBULATORY_CARE_PROVIDER_SITE_OTHER): Payer: Medicaid Other | Admitting: Family Medicine

## 2021-08-12 ENCOUNTER — Other Ambulatory Visit: Payer: Self-pay

## 2021-08-12 VITALS — BP 123/79 | HR 120 | Wt 222.5 lb

## 2021-08-12 DIAGNOSIS — O09899 Supervision of other high risk pregnancies, unspecified trimester: Secondary | ICD-10-CM

## 2021-08-12 DIAGNOSIS — O09892 Supervision of other high risk pregnancies, second trimester: Secondary | ICD-10-CM

## 2021-08-12 DIAGNOSIS — Z3491 Encounter for supervision of normal pregnancy, unspecified, first trimester: Secondary | ICD-10-CM

## 2021-08-12 DIAGNOSIS — Z3492 Encounter for supervision of normal pregnancy, unspecified, second trimester: Secondary | ICD-10-CM

## 2021-08-12 DIAGNOSIS — Z6791 Unspecified blood type, Rh negative: Secondary | ICD-10-CM

## 2021-08-12 DIAGNOSIS — E039 Hypothyroidism, unspecified: Secondary | ICD-10-CM

## 2021-08-12 DIAGNOSIS — Z3A16 16 weeks gestation of pregnancy: Secondary | ICD-10-CM

## 2021-08-12 DIAGNOSIS — Z2839 Other underimmunization status: Secondary | ICD-10-CM

## 2021-08-12 DIAGNOSIS — O26892 Other specified pregnancy related conditions, second trimester: Secondary | ICD-10-CM

## 2021-08-12 DIAGNOSIS — O26899 Other specified pregnancy related conditions, unspecified trimester: Secondary | ICD-10-CM

## 2021-08-12 MED ORDER — LEVOTHYROXINE SODIUM 25 MCG PO TABS: 25.0000 ug | ORAL_TABLET | Freq: Every day | ORAL | 2 refills | Status: DC

## 2021-08-12 NOTE — Progress Notes (Signed)
   PRENATAL VISIT NOTE  Subjective:  Ann Hammond is a 31 y.o. G2P1001 at [redacted]w[redacted]d being seen today for ongoing prenatal care.  She is currently monitored for the following issues for this low-risk pregnancy and has Hypothyroidism; PCOS (polycystic ovarian syndrome); Supervision of low-risk pregnancy; Rh negative state in antepartum period; and Rubella non-immune status, antepartum on their problem list.  Patient reports no bleeding and no contractions.  Contractions: Not present. Vag. Bleeding: None.  Movement: Absent. Denies leaking of fluid.   The following portions of the patient's history were reviewed and updated as appropriate: allergies, current medications, past family history, past medical history, past social history, past surgical history and problem list.   Objective:   Vitals:   08/12/21 1412  BP: 123/79  Pulse: (!) 120  Weight: 222 lb 8 oz (100.9 kg)    Fetal Status: Fetal Heart Rate (bpm): 147   Movement: Absent     General:  Alert, oriented and cooperative. Patient is in no acute distress.  Skin: Skin is warm and dry. No rash noted.   Cardiovascular: Normal heart rate noted  Respiratory: Normal respiratory effort, no problems with respiration noted  Abdomen: Soft, gravid, appropriate for gestational age.  Pain/Pressure: Absent     Pelvic: Cervical exam deferred        Extremities: Normal range of motion.  Edema: None  Mental Status: Normal mood and affect. Normal behavior. Normal judgment and thought content.   Assessment and Plan:  Pregnancy: G2P1001 at [redacted]w[redacted]d 1. Rubella non-immune status, antepartum -MMR postpartum  2. Rh negative state in antepartum period -Rhogam at 28 wks -postpartum if indicated  3. Encounter for supervision of low-risk pregnancy in first trimester -Doing well today with no concerns AFP today  4. Hypothyroidism, unspecified type -Doing well on synthroid $RemoveBefo'25mg'qFBGbBEerGo$  -Needs refill Repeat TSH later in pregnancy, most recently WNL  07/2021  Term labor symptoms and general obstetric precautions including but not limited to vaginal bleeding, contractions, leaking of fluid and fetal movement were reviewed in detail with the patient. Please refer to After Visit Summary for other counseling recommendations.   Return in 4 weeks (on 09/09/2021).  Future Appointments  Date Time Provider [place]  08/31/2021 10:15 AM [facility] NURSE [facility]  08/31/2021 10:30 AM [facility] US3 [facility] [place], Medical Student

## 2021-08-17 LAB — AFP, SERUM, OPEN SPINA BIFIDA
AFP MoM: 0.64
AFP Value: 18.1 ng/mL
Gest. Age on Collection Date: 16.3 weeks
Maternal Age At EDD: 32.9 yr
OSBR Risk 1 IN: 10000
Test Results:: NEGATIVE
Weight: 223 [lb_av]

## 2021-08-31 ENCOUNTER — Ambulatory Visit: Payer: Medicaid Other | Admitting: *Deleted

## 2021-08-31 ENCOUNTER — Ambulatory Visit: Payer: Medicaid Other | Attending: Family Medicine

## 2021-08-31 ENCOUNTER — Other Ambulatory Visit: Payer: Self-pay | Admitting: *Deleted

## 2021-08-31 ENCOUNTER — Encounter: Payer: Self-pay | Admitting: *Deleted

## 2021-08-31 VITALS — BP 116/63 | HR 91

## 2021-08-31 DIAGNOSIS — O99212 Obesity complicating pregnancy, second trimester: Secondary | ICD-10-CM

## 2021-08-31 DIAGNOSIS — E039 Hypothyroidism, unspecified: Secondary | ICD-10-CM

## 2021-08-31 DIAGNOSIS — Z6791 Unspecified blood type, Rh negative: Secondary | ICD-10-CM | POA: Insufficient documentation

## 2021-08-31 DIAGNOSIS — Z3491 Encounter for supervision of normal pregnancy, unspecified, first trimester: Secondary | ICD-10-CM | POA: Diagnosis not present

## 2021-08-31 DIAGNOSIS — O09899 Supervision of other high risk pregnancies, unspecified trimester: Secondary | ICD-10-CM | POA: Insufficient documentation

## 2021-08-31 DIAGNOSIS — O99282 Endocrine, nutritional and metabolic diseases complicating pregnancy, second trimester: Secondary | ICD-10-CM | POA: Insufficient documentation

## 2021-08-31 DIAGNOSIS — Z3A19 19 weeks gestation of pregnancy: Secondary | ICD-10-CM | POA: Insufficient documentation

## 2021-08-31 DIAGNOSIS — E282 Polycystic ovarian syndrome: Secondary | ICD-10-CM | POA: Insufficient documentation

## 2021-08-31 DIAGNOSIS — Z2839 Other underimmunization status: Secondary | ICD-10-CM | POA: Insufficient documentation

## 2021-08-31 DIAGNOSIS — O321XX0 Maternal care for breech presentation, not applicable or unspecified: Secondary | ICD-10-CM | POA: Insufficient documentation

## 2021-08-31 DIAGNOSIS — O26892 Other specified pregnancy related conditions, second trimester: Secondary | ICD-10-CM | POA: Insufficient documentation

## 2021-08-31 DIAGNOSIS — O26899 Other specified pregnancy related conditions, unspecified trimester: Secondary | ICD-10-CM | POA: Insufficient documentation

## 2021-08-31 DIAGNOSIS — O360121 Maternal care for anti-D [Rh] antibodies, second trimester, fetus 1: Secondary | ICD-10-CM

## 2021-08-31 DIAGNOSIS — Z363 Encounter for antenatal screening for malformations: Secondary | ICD-10-CM | POA: Diagnosis present

## 2021-09-03 ENCOUNTER — Encounter: Payer: Self-pay | Admitting: Family Medicine

## 2021-09-09 ENCOUNTER — Ambulatory Visit (INDEPENDENT_AMBULATORY_CARE_PROVIDER_SITE_OTHER): Payer: Medicaid Other | Admitting: Family Medicine

## 2021-09-09 ENCOUNTER — Other Ambulatory Visit: Payer: Self-pay

## 2021-09-09 VITALS — BP 118/73 | HR 89 | Wt 224.0 lb

## 2021-09-09 DIAGNOSIS — Z6791 Unspecified blood type, Rh negative: Secondary | ICD-10-CM

## 2021-09-09 DIAGNOSIS — O26899 Other specified pregnancy related conditions, unspecified trimester: Secondary | ICD-10-CM

## 2021-09-09 DIAGNOSIS — Z3492 Encounter for supervision of normal pregnancy, unspecified, second trimester: Secondary | ICD-10-CM

## 2021-09-09 DIAGNOSIS — Z2839 Other underimmunization status: Secondary | ICD-10-CM

## 2021-09-09 DIAGNOSIS — O09899 Supervision of other high risk pregnancies, unspecified trimester: Secondary | ICD-10-CM

## 2021-09-09 DIAGNOSIS — E039 Hypothyroidism, unspecified: Secondary | ICD-10-CM

## 2021-09-09 NOTE — Progress Notes (Signed)
   PRENATAL VISIT NOTE  Subjective:  Ann Hammond is a 31 y.o. G2P1001 at 63w2dbeing seen today for ongoing prenatal care.  She is currently monitored for the following issues for this low-risk pregnancy and has Hypothyroidism; PCOS (polycystic ovarian syndrome); Supervision of low-risk pregnancy; Rh negative state in antepartum period; and Rubella non-immune status, antepartum on their problem list.  Patient reports no complaints.  Contractions: Not present. Vag. Bleeding: None.  Movement: Present. Denies leaking of fluid.   The following portions of the patient's history were reviewed and updated as appropriate: allergies, current medications, past family history, past medical history, past social history, past surgical history and problem list.   Objective:   Vitals:   09/09/21 1031  BP: 118/73  Pulse: 89  Weight: 224 lb (101.6 kg)    Fetal Status: Fetal Heart Rate (bpm): 148   Movement: Present     General:  Alert, oriented and cooperative. Patient is in no acute distress.  Skin: Skin is warm and dry. No rash noted.   Cardiovascular: Normal heart rate noted  Respiratory: Normal respiratory effort, no problems with respiration noted  Abdomen: Soft, gravid, appropriate for gestational age.  Pain/Pressure: Present     Pelvic: Cervical exam deferred        Extremities: Normal range of motion.  Edema: None  Mental Status: Normal mood and affect. Normal behavior. Normal judgment and thought content.   Assessment and Plan:  Pregnancy: G2P1001 at 230w2d. Hypothyroidism, unspecified type Repeat with 28 wk labs  2. Rh negative state in antepartum period Rhogam at 28 weeks and pp if indicated  3. Rubella non-immune status, antepartum MMR pp  4. Encounter for supervision of low-risk pregnancy in second trimester Continue routine prenatal care.   Preterm labor symptoms and general obstetric precautions including but not limited to vaginal bleeding, contractions, leaking of  fluid and fetal movement were reviewed in detail with the patient. Please refer to After Visit Summary for other counseling recommendations.   Return in 4 weeks (on 10/07/2021).  Future Appointments  Date Time Provider [place]9/26/2023  8:15 AM [facility]/26/2023  8:30 AM [facility] US2 [facility]/29/2023 10:15 AM Sm[facility]  TaDonnamae JudeMD

## 2021-09-14 ENCOUNTER — Encounter: Payer: Self-pay | Admitting: Family Medicine

## 2021-10-05 ENCOUNTER — Ambulatory Visit: Payer: Medicaid Other | Attending: Maternal & Fetal Medicine

## 2021-10-05 ENCOUNTER — Other Ambulatory Visit: Payer: Self-pay | Admitting: *Deleted

## 2021-10-05 ENCOUNTER — Ambulatory Visit: Payer: Medicaid Other | Admitting: *Deleted

## 2021-10-05 VITALS — BP 110/73 | HR 105

## 2021-10-05 DIAGNOSIS — Z2839 Other underimmunization status: Secondary | ICD-10-CM | POA: Diagnosis present

## 2021-10-05 DIAGNOSIS — E039 Hypothyroidism, unspecified: Secondary | ICD-10-CM | POA: Insufficient documentation

## 2021-10-05 DIAGNOSIS — Z6791 Unspecified blood type, Rh negative: Secondary | ICD-10-CM

## 2021-10-05 DIAGNOSIS — O26899 Other specified pregnancy related conditions, unspecified trimester: Secondary | ICD-10-CM | POA: Diagnosis present

## 2021-10-05 DIAGNOSIS — O360190 Maternal care for anti-D [Rh] antibodies, unspecified trimester, not applicable or unspecified: Secondary | ICD-10-CM

## 2021-10-05 DIAGNOSIS — O09899 Supervision of other high risk pregnancies, unspecified trimester: Secondary | ICD-10-CM | POA: Insufficient documentation

## 2021-10-05 DIAGNOSIS — E282 Polycystic ovarian syndrome: Secondary | ICD-10-CM | POA: Diagnosis present

## 2021-10-05 DIAGNOSIS — O99212 Obesity complicating pregnancy, second trimester: Secondary | ICD-10-CM

## 2021-10-05 DIAGNOSIS — Z3A24 24 weeks gestation of pregnancy: Secondary | ICD-10-CM

## 2021-10-05 DIAGNOSIS — E669 Obesity, unspecified: Secondary | ICD-10-CM

## 2021-10-05 DIAGNOSIS — O99280 Endocrine, nutritional and metabolic diseases complicating pregnancy, unspecified trimester: Secondary | ICD-10-CM | POA: Insufficient documentation

## 2021-10-05 DIAGNOSIS — O99282 Endocrine, nutritional and metabolic diseases complicating pregnancy, second trimester: Secondary | ICD-10-CM

## 2021-10-07 NOTE — Progress Notes (Signed)
   PRENATAL VISIT NOTE  Subjective:  Ann Hammond is a 31 y.o. G2P1001 at [redacted]w[redacted]d being seen today for ongoing prenatal care.  She is currently monitored for the following issues for this low-risk pregnancy and has Hypothyroidism; PCOS (polycystic ovarian syndrome); Supervision of low-risk pregnancy; Rh negative state in antepartum period; Rubella non-immune status, antepartum; Subclinical hypothyroidism; and Polycystic ovary syndrome on their problem list.  Patient reports no complaints.  Contractions: Not present. Vag. Bleeding: None.  Movement: Present. Denies leaking of fluid.   The following portions of the patient's history were reviewed and updated as appropriate: allergies, current medications, past family history, past medical history, past social history, past surgical history and problem list.   Objective:   Vitals:   10/08/21 1011  BP: 115/75  Pulse: 98  Weight: 228 lb 4.8 oz (103.6 kg)    Fetal Status: Fetal Heart Rate (bpm): 150 Fundal Height: 29 cm Movement: Present     General:  Alert, oriented and cooperative. Patient is in no acute distress.  Skin: Skin is warm and dry. No rash noted.   Cardiovascular: Normal heart rate noted  Respiratory: Normal respiratory effort, no problems with respiration noted  Abdomen: Soft, gravid, appropriate for gestational age.  Pain/Pressure: Absent     Pelvic: Cervical exam deferred        Extremities: Normal range of motion.  Edema: None  Mental Status: Normal mood and affect. Normal behavior. Normal judgment and thought content.   Assessment and Plan:  Pregnancy: G2P1001 at [redacted]w[redacted]d 1. Hypothyroidism, unspecified type - TSH w/ 28 weeks labs  2. Rh negative state in antepartum period - Rhophylac and antibody screen at 28 weeks  3. Rubella non-immune status, antepartum - MMR PP  4. Encounter for supervision of low-risk pregnancy in second trimester - 28 week labs NV - Flu vaccine declined  5. [redacted] weeks gestation of  pregnancy   Preterm labor symptoms and general obstetric precautions including but not limited to vaginal bleeding, contractions, leaking of fluid and fetal movement were reviewed in detail with the patient. Please refer to After Visit Summary for other counseling recommendations.   Return in about 3 weeks (around 10/29/2021) for ROB/GTT.  Future Appointments  Date Time Provider [place]  11/04/2021  8:35 AM [place] [facility]  11/04/2021  9:30 AM [facility] LAB [facility]  11/29/2021  9:15 AM Shelda Pal, DO [facility]  11/29/2021 10:45 AM [facility] NURSE [facility]  11/29/2021 11:00 AM [facility] US1 [facility] [place], CNM

## 2021-10-07 NOTE — Patient Instructions (Addendum)
[email or web address]     TDaP Vaccine Pregnancy Get the Whooping Cough Vaccine While You Are Pregnant (CDC)  It is important for women to get the whooping cough vaccine in the third trimester of each pregnancy. Vaccines are the best way to prevent this disease. There are 2 different whooping cough vaccines. Both vaccines combine protection against whooping cough, tetanus and diphtheria, but they are for different age groups: Tdap: for everyone 11 years or older, including pregnant women  DTaP: for children 2 months through 6 years of age  You need the whooping cough vaccine during each of your pregnancies The recommended time to get the shot is during your 27th through 36th week of pregnancy, preferably during the earlier part of this time period. The Centers for Disease Control and Prevention (CDC) recommends that pregnant women receive the whooping cough vaccine for adolescents and adults (called Tdap vaccine) during the third trimester of each pregnancy. The recommended time to get the shot is during your 27th through 36th week of pregnancy, preferably during the earlier part of this time period. This replaces the original recommendation that pregnant women get the vaccine only if they had not previously received it. The American College of Obstetricians and Gynecologists and the American College of Nurse-Midwives support this recommendation.  You should get the whooping cough vaccine while pregnant to pass protection to your baby frame support disabled and/or not supported in this browser  Learn why Ann Hammond decided to get the whooping cough vaccine in her 3rd trimester of pregnancy and how her baby girl was born with some protection against the disease. Also available on YouTube. After receiving the whooping cough vaccine, your body will create protective antibodies (proteins produced by the body to fight off diseases) and pass some of them to your baby before birth. These antibodies  provide your baby some short-term protection against whooping cough in early life. These antibodies can also protect your baby from some of the more serious complications that come along with whooping cough. Your protective antibodies are at their highest about 2 weeks after getting the vaccine, but it takes time to pass them to your baby. So the preferred time to get the whooping cough vaccine is early in your third trimester. The amount of whooping cough antibodies in your body decreases over time. That is why CDC recommends you get a whooping cough vaccine during each pregnancy. Doing so allows each of your babies to get the greatest number of protective antibodies from you. This means each of your babies will get the best protection possible against this disease.  Getting the whooping cough vaccine while pregnant is better than getting the vaccine after you give birth Whooping cough vaccination during pregnancy is ideal so your baby will have short-term protection as soon as he is born. This early protection is important because your baby will not start getting his whooping cough vaccines until he is 2 months old. These first few months of life are when your baby is at greatest risk for catching whooping cough. This is also when he's at greatest risk for having severe, potentially life-threating complications from the infection. To avoid that gap in protection, it is best to get a whooping cough vaccine during pregnancy. You will then pass protection to your baby before he is born. To continue protecting your baby, he should get whooping cough vaccines starting at 2 months old. You may never have gotten the Tdap vaccine before and did not get it during this pregnancy. If   so, you should make sure to get the vaccine immediately after you give birth, before leaving the hospital or birthing center. It will take about 2 weeks before your body develops protection (antibodies) in response to the vaccine. Once you  have protection from the vaccine, you are less likely to give whooping cough to your newborn while caring for him. But remember, your baby will still be at risk for catching whooping cough from others. A recent study looked to see how effective Tdap was at preventing whooping cough in babies whose mothers got the vaccine while pregnant or in the hospital after giving birth. The study found that getting Tdap between 27 through 36 weeks of pregnancy is 85% more effective at preventing whooping cough in babies younger than 2 months old. Blood tests cannot tell if you need a whooping cough vaccine There are no blood tests that can tell you if you have enough antibodies in your body to protect yourself or your baby against whooping cough. Even if you have been sick with whooping cough in the past or previously received the vaccine, you still should get the vaccine during each pregnancy. Breastfeeding may pass some protective antibodies onto your baby By breastfeeding, you may pass some antibodies you have made in response to the vaccine to your baby. When you get a whooping cough vaccine during your pregnancy, you will have antibodies in your breast milk that you can share with your baby as soon as your milk comes in. However, your baby will not get protective antibodies immediately if you wait to get the whooping cough vaccine until after delivering your baby. This is because it takes about 2 weeks for your body to create antibodies. Learn more about the health benefits of breastfeeding.  

## 2021-10-08 ENCOUNTER — Other Ambulatory Visit: Payer: Self-pay

## 2021-10-08 ENCOUNTER — Ambulatory Visit (INDEPENDENT_AMBULATORY_CARE_PROVIDER_SITE_OTHER): Payer: Medicaid Other | Admitting: Advanced Practice Midwife

## 2021-10-08 VITALS — BP 115/75 | HR 98 | Wt 228.3 lb

## 2021-10-08 DIAGNOSIS — Z2839 Other underimmunization status: Secondary | ICD-10-CM

## 2021-10-08 DIAGNOSIS — Z3A24 24 weeks gestation of pregnancy: Secondary | ICD-10-CM

## 2021-10-08 DIAGNOSIS — O26899 Other specified pregnancy related conditions, unspecified trimester: Secondary | ICD-10-CM

## 2021-10-08 DIAGNOSIS — O09899 Supervision of other high risk pregnancies, unspecified trimester: Secondary | ICD-10-CM

## 2021-10-08 DIAGNOSIS — Z3492 Encounter for supervision of normal pregnancy, unspecified, second trimester: Secondary | ICD-10-CM

## 2021-10-08 DIAGNOSIS — E039 Hypothyroidism, unspecified: Secondary | ICD-10-CM

## 2021-10-08 DIAGNOSIS — Z6791 Unspecified blood type, Rh negative: Secondary | ICD-10-CM

## 2021-11-04 ENCOUNTER — Other Ambulatory Visit: Payer: Self-pay

## 2021-11-08 ENCOUNTER — Other Ambulatory Visit: Payer: Medicaid Other

## 2021-11-08 ENCOUNTER — Ambulatory Visit (INDEPENDENT_AMBULATORY_CARE_PROVIDER_SITE_OTHER): Payer: Medicaid Other | Admitting: Student

## 2021-11-08 ENCOUNTER — Other Ambulatory Visit: Payer: Self-pay | Admitting: General Practice

## 2021-11-08 ENCOUNTER — Encounter: Payer: Self-pay | Admitting: *Deleted

## 2021-11-08 ENCOUNTER — Other Ambulatory Visit: Payer: Self-pay

## 2021-11-08 VITALS — BP 114/61 | HR 99 | Wt 232.7 lb

## 2021-11-08 DIAGNOSIS — R12 Heartburn: Secondary | ICD-10-CM

## 2021-11-08 DIAGNOSIS — Z3493 Encounter for supervision of normal pregnancy, unspecified, third trimester: Secondary | ICD-10-CM

## 2021-11-08 DIAGNOSIS — Z3A28 28 weeks gestation of pregnancy: Secondary | ICD-10-CM

## 2021-11-08 DIAGNOSIS — Z23 Encounter for immunization: Secondary | ICD-10-CM | POA: Diagnosis not present

## 2021-11-08 DIAGNOSIS — Z6791 Unspecified blood type, Rh negative: Secondary | ICD-10-CM

## 2021-11-08 DIAGNOSIS — O26893 Other specified pregnancy related conditions, third trimester: Secondary | ICD-10-CM

## 2021-11-08 DIAGNOSIS — E039 Hypothyroidism, unspecified: Secondary | ICD-10-CM

## 2021-11-08 MED ORDER — RHO D IMMUNE GLOBULIN 1500 UNIT/2ML IJ SOSY
300.0000 ug | PREFILLED_SYRINGE | Freq: Once | INTRAMUSCULAR | Status: AC
  Administered 2021-11-08: 300 ug via INTRAMUSCULAR

## 2021-11-08 MED ORDER — FAMOTIDINE 20 MG PO TABS: 20.0000 mg | ORAL_TABLET | Freq: Every day | ORAL | 1 refills | Status: DC

## 2021-11-08 NOTE — Addendum Note (Signed)
Addended by: Michel Harrow on: 11/08/2021 10:15 AM   Modules accepted: Orders

## 2021-11-08 NOTE — Progress Notes (Signed)
   PRENATAL VISIT NOTE  Subjective:  Ann Hammond is a 31 y.o. G2P1001 at [redacted]w[redacted]d being seen today for ongoing prenatal care.  She is currently monitored for the following issues for this high-risk pregnancy and has Hypothyroidism; PCOS (polycystic ovarian syndrome); Supervision of low-risk pregnancy; Rh negative state in antepartum period; Rubella non-immune status, antepartum; Subclinical hypothyroidism; and Polycystic ovary syndrome on their problem list.  Patient reports heartburn. Reports daily heartburn that she has been treating with tums. Has been taking 4 tums every night. Everything makes symptoms worse, including water.    Contractions: Not present. Vag. Bleeding: None.  Movement: Present. Denies leaking of fluid.   The following portions of the patient's history were reviewed and updated as appropriate: allergies, current medications, past family history, past medical history, past social history, past surgical history and problem list.   Objective:   Vitals:   11/08/21 0833  BP: 114/61  Pulse: 99  Weight: 232 lb 11.2 oz (105.6 kg)    Fetal Status: Fetal Heart Rate (bpm): 152 Fundal Height: 30 cm Movement: Present     General:  Alert, oriented and cooperative. Patient is in no acute distress.  Skin: Skin is warm and dry. No rash noted.   Cardiovascular: Normal heart rate noted  Respiratory: Normal respiratory effort, no problems with respiration noted  Abdomen: Soft, gravid, appropriate for gestational age.  Pain/Pressure: Absent     Pelvic: Cervical exam deferred        Extremities: Normal range of motion.  Edema: None  Mental Status: Normal mood and affect. Normal behavior. Normal judgment and thought content.   Assessment and Plan:  Pregnancy: G2P1001 at [redacted]w[redacted]d 1. Encounter for supervision of low-risk pregnancy in third trimester -Doing well today aside from heartburn. Reviewed anatomy ultrasound - is scheduled for f/u growth ultrasound next month.   2. Heartburn  during pregnancy in third trimester  - famotidine (PEPCID) 20 MG tablet; Take 1 tablet (20 mg total) by mouth daily.  Dispense: 30 tablet; Refill: 1  3. Hypothyroidism, unspecified type  - TSH  4. Rh negative state in antepartum period -Given rhogam today  5. [redacted] weeks gestation of pregnancy   Preterm labor symptoms and general obstetric precautions including but not limited to vaginal bleeding, contractions, leaking of fluid and fetal movement were reviewed in detail with the patient. Please refer to After Visit Summary for other counseling recommendations.   Return in about 2 weeks (around 11/22/2021) for Routine OB.  Future Appointments  Date Time Provider [place]  11/08/2021  9:15 AM Jorje Guild, NP [facility]  11/29/2021  9:15 AM Shelda Pal, DO [facility]  11/29/2021 10:45 AM [facility] NURSE [facility]  11/29/2021 11:00 AM [facility] US1 [facility] [place], NP

## 2021-11-09 LAB — CBC
Hematocrit: 34.6 % (ref 34.0–46.6)
Hemoglobin: 11.6 g/dL (ref 11.1–15.9)
MCH: 29.7 pg (ref 26.6–33.0)
MCHC: 33.5 g/dL (ref 31.5–35.7)
MCV: 89 fL (ref 79–97)
Platelets: 226 10*3/uL (ref 150–450)
RBC: 3.91 x10E6/uL (ref 3.77–5.28)
RDW: 11.9 % (ref 11.7–15.4)
WBC: 9.8 10*3/uL (ref 3.4–10.8)

## 2021-11-09 LAB — TSH: TSH: 2.46 u[IU]/mL (ref 0.450–4.500)

## 2021-11-09 LAB — RPR: RPR Ser Ql: NONREACTIVE

## 2021-11-09 LAB — GLUCOSE TOLERANCE, 2 HOURS W/ 1HR
Glucose, 1 hour: 157 mg/dL (ref 70–179)
Glucose, 2 hour: 111 mg/dL (ref 70–152)
Glucose, Fasting: 87 mg/dL (ref 70–91)

## 2021-11-09 LAB — ANTIBODY SCREEN: Antibody Screen: NEGATIVE

## 2021-11-09 LAB — HIV ANTIBODY (ROUTINE TESTING W REFLEX): HIV Screen 4th Generation wRfx: NONREACTIVE

## 2021-11-28 NOTE — Progress Notes (Unsigned)
   PRENATAL VISIT NOTE  Subjective:  Ann Hammond is a 31 y.o. G2P1001 at [redacted]w[redacted]d being seen today for ongoing prenatal care.  She is currently monitored for the following issues for this {Blank single:19197::"high-risk","low-risk"} pregnancy and has Hypothyroidism; PCOS (polycystic ovarian syndrome); Supervision of low-risk pregnancy; Rh negative state in antepartum period; Rubella non-immune status, antepartum; Subclinical hypothyroidism; and Polycystic ovary syndrome on their problem list.  Patient reports {sx:[id number]}. ***   .  .   . Denies leaking of fluid.   The following portions of the patient's history were reviewed and updated as appropriate: allergies, current medications, past family history, past medical history, past social history, past surgical history and problem list.   Objective:  There were no vitals filed for this visit.  Fetal Status:           General:  Alert, oriented and cooperative. Patient is in no acute distress.  Skin: Skin is warm and dry. No rash noted.   Cardiovascular: Normal heart rate noted  Respiratory: Normal respiratory effort, no problems with respiration noted  Abdomen: Soft, gravid, appropriate for gestational age.        Pelvic: {Blank single:19197::"Cervical exam performed in the presence of a chaperone","Cervical exam deferred"}        Extremities: Normal range of motion.     Mental Status: Normal mood and affect. Normal behavior. Normal judgment and thought content.   Assessment and Plan:  Pregnancy: G2P1001 at [redacted]w[redacted]d  1. Encounter for supervision of low-risk pregnancy in third trimester  2. Rubella non-immune status, antepartum  3. Rh negative state in antepartum period    {Blank single:19197::"Term","Preterm"} labor symptoms and general obstetric precautions including but not limited to vaginal bleeding, contractions, leaking of fluid and fetal movement were reviewed in detail with the patient. Please refer to After Visit Summary for  other counseling recommendations.   No follow-ups on file.  Future Appointments  Date Time Provider Department Center  11/29/2021  9:15 AM Babs Sciara [facility]  11/29/2021 10:45 AM [facility] NURSE [facility]  11/29/2021 11:00 AM [facility] US1 [facility]    Myrtie Hawk, DO FMOB Fellow, Faculty practice [facility], Center for [facility] 11/28/21  8:38 PM

## 2021-11-29 ENCOUNTER — Other Ambulatory Visit: Payer: Self-pay

## 2021-11-29 ENCOUNTER — Ambulatory Visit: Payer: Medicaid Other | Attending: Obstetrics and Gynecology

## 2021-11-29 ENCOUNTER — Ambulatory Visit (INDEPENDENT_AMBULATORY_CARE_PROVIDER_SITE_OTHER): Payer: Medicaid Other | Admitting: Family Medicine

## 2021-11-29 ENCOUNTER — Ambulatory Visit: Payer: Medicaid Other | Admitting: *Deleted

## 2021-11-29 VITALS — BP 127/73 | HR 108

## 2021-11-29 VITALS — BP 132/79 | HR 116 | Wt 237.0 lb

## 2021-11-29 DIAGNOSIS — O99212 Obesity complicating pregnancy, second trimester: Secondary | ICD-10-CM | POA: Diagnosis present

## 2021-11-29 DIAGNOSIS — Z3689 Encounter for other specified antenatal screening: Secondary | ICD-10-CM | POA: Diagnosis present

## 2021-11-29 DIAGNOSIS — E039 Hypothyroidism, unspecified: Secondary | ICD-10-CM | POA: Insufficient documentation

## 2021-11-29 DIAGNOSIS — O99213 Obesity complicating pregnancy, third trimester: Secondary | ICD-10-CM

## 2021-11-29 DIAGNOSIS — O99282 Endocrine, nutritional and metabolic diseases complicating pregnancy, second trimester: Secondary | ICD-10-CM | POA: Insufficient documentation

## 2021-11-29 DIAGNOSIS — O09899 Supervision of other high risk pregnancies, unspecified trimester: Secondary | ICD-10-CM

## 2021-11-29 DIAGNOSIS — O99283 Endocrine, nutritional and metabolic diseases complicating pregnancy, third trimester: Secondary | ICD-10-CM

## 2021-11-29 DIAGNOSIS — E669 Obesity, unspecified: Secondary | ICD-10-CM | POA: Diagnosis not present

## 2021-11-29 DIAGNOSIS — O26899 Other specified pregnancy related conditions, unspecified trimester: Secondary | ICD-10-CM

## 2021-11-29 DIAGNOSIS — Z3A31 31 weeks gestation of pregnancy: Secondary | ICD-10-CM

## 2021-11-29 DIAGNOSIS — Z3493 Encounter for supervision of normal pregnancy, unspecified, third trimester: Secondary | ICD-10-CM

## 2021-11-29 DIAGNOSIS — O360130 Maternal care for anti-D [Rh] antibodies, third trimester, not applicable or unspecified: Secondary | ICD-10-CM

## 2021-11-29 DIAGNOSIS — Z6791 Unspecified blood type, Rh negative: Secondary | ICD-10-CM

## 2021-11-29 DIAGNOSIS — Z2839 Other underimmunization status: Secondary | ICD-10-CM

## 2021-12-01 ENCOUNTER — Encounter: Payer: Self-pay | Admitting: Certified Nurse Midwife

## 2021-12-05 ENCOUNTER — Encounter: Payer: Self-pay | Admitting: Family Medicine

## 2021-12-14 ENCOUNTER — Ambulatory Visit (INDEPENDENT_AMBULATORY_CARE_PROVIDER_SITE_OTHER): Payer: Medicaid Other | Admitting: Advanced Practice Midwife

## 2021-12-14 ENCOUNTER — Other Ambulatory Visit: Payer: Self-pay

## 2021-12-14 VITALS — BP 133/84 | HR 107 | Wt 238.7 lb

## 2021-12-14 DIAGNOSIS — Z3A34 34 weeks gestation of pregnancy: Secondary | ICD-10-CM

## 2021-12-14 DIAGNOSIS — O26893 Other specified pregnancy related conditions, third trimester: Secondary | ICD-10-CM

## 2021-12-14 DIAGNOSIS — R12 Heartburn: Secondary | ICD-10-CM

## 2021-12-14 DIAGNOSIS — Z3493 Encounter for supervision of normal pregnancy, unspecified, third trimester: Secondary | ICD-10-CM

## 2021-12-14 MED ORDER — FAMOTIDINE 20 MG PO TABS: 20.0000 mg | ORAL_TABLET | Freq: Two times a day (BID) | ORAL | 3 refills | Status: DC

## 2021-12-14 NOTE — Progress Notes (Signed)
   PRENATAL VISIT NOTE  Subjective:  Ann Hammond is a 31 y.o. G2P1001 at [redacted]w[redacted]d being seen today for ongoing prenatal care.  She is currently monitored for the following issues for this low-risk pregnancy and has Hypothyroidism; PCOS (polycystic ovarian syndrome); Supervision of low-risk pregnancy; Rh negative state in antepartum period; Rubella non-immune status, antepartum; Subclinical hypothyroidism; and Polycystic ovary syndrome on their problem list.  Patient reports occasional contractions, heartburn not controlled by Pepcid QD.  Contractions: Irritability. Vag. Bleeding: None.  Movement: Present. Denies leaking of fluid.   The following portions of the patient's history were reviewed and updated as appropriate: allergies, current medications, past family history, past medical history, past social history, past surgical history and problem list.   Objective:   Vitals:   12/14/21 1124  BP: 133/84  Pulse: (!) 107  Weight: 238 lb 11.2 oz (108.3 kg)    Fetal Status: Fetal Heart Rate (bpm): 147   Movement: Present     General:  Alert, oriented and cooperative. Patient is in no acute distress.  Skin: Skin is warm and dry. No rash noted.   Cardiovascular: Normal heart rate noted  Respiratory: Normal respiratory effort, no problems with respiration noted  Abdomen: Soft, gravid, appropriate for gestational age.  Pain/Pressure: Present     Pelvic: Cervical exam declind        Extremities: Normal range of motion.  Edema: Trace  Mental Status: Normal mood and affect. Normal behavior. Normal judgment and thought content.   Assessment and Plan:  Pregnancy: G2P1001 at [redacted]w[redacted]d 1. Encounter for supervision of low-risk pregnancy in third trimester - GBS and cultures at [facility]  2. Heartburn during pregnancy in third trimester  - famotidine (PEPCID) 20 MG tablet; Take 1 tablet (20 mg total) by mouth 2 (two) times daily.  Dispense: 30 tablet; Refill: 3  3. [redacted] weeks gestation of  pregnancy   Preterm labor symptoms and general obstetric precautions including but not limited to vaginal bleeding, contractions, leaking of fluid and fetal movement were reviewed in detail with the patient. Please refer to After Visit Summary for other counseling recommendations.   No follow-ups on file.  Future Appointments  Date Time Provider Department Center  12/24/2021 10:55 AM Kathlene Cote [facility]    Dorathy Kinsman, [place]

## 2021-12-16 ENCOUNTER — Telehealth: Payer: Self-pay | Admitting: Family Medicine

## 2021-12-16 NOTE — Telephone Encounter (Signed)
Called pt and she did not answer. Message left that I am responding to her question and someone will call back.

## 2021-12-16 NOTE — Telephone Encounter (Signed)
Patient called in wanting to move appointment scheduled for the 15th. Once the patient was told we do not have anything to move the appointment to she asked to just cancel and wanted a nurse to double confirm that it was okay to wait until the 27th for her swab.

## 2021-12-20 NOTE — Telephone Encounter (Signed)
Called patient stating I am returning her phone call. Patient states she had an appt this Friday but had to move it and is now not coming until 12/27. She wants to know if it is okay to wait until then to have her gbs/gc swabs done. Told patient that is perfectly fine. Patient verbalized understanding & had no other questions.

## 2021-12-24 ENCOUNTER — Encounter: Payer: Medicaid Other | Admitting: Medical

## 2022-01-03 ENCOUNTER — Other Ambulatory Visit: Payer: Self-pay | Admitting: Student

## 2022-01-03 DIAGNOSIS — O26893 Other specified pregnancy related conditions, third trimester: Secondary | ICD-10-CM

## 2022-01-05 ENCOUNTER — Other Ambulatory Visit: Payer: Self-pay

## 2022-01-05 ENCOUNTER — Other Ambulatory Visit (HOSPITAL_COMMUNITY)
Admission: RE | Admit: 2022-01-05 | Discharge: 2022-01-05 | Disposition: A | Discharge status: Home / Self Care | Payer: Medicaid Other | Source: Ambulatory Visit | Attending: Advanced Practice Midwife | Admitting: Advanced Practice Midwife

## 2022-01-05 ENCOUNTER — Ambulatory Visit (INDEPENDENT_AMBULATORY_CARE_PROVIDER_SITE_OTHER): Payer: Medicaid Other | Admitting: Advanced Practice Midwife

## 2022-01-05 VITALS — BP 123/83 | HR 103 | Wt 242.4 lb

## 2022-01-05 DIAGNOSIS — Z6791 Unspecified blood type, Rh negative: Secondary | ICD-10-CM

## 2022-01-05 DIAGNOSIS — Z3A37 37 weeks gestation of pregnancy: Secondary | ICD-10-CM

## 2022-01-05 DIAGNOSIS — O09893 Supervision of other high risk pregnancies, third trimester: Secondary | ICD-10-CM

## 2022-01-05 DIAGNOSIS — Z124 Encounter for screening for malignant neoplasm of cervix: Secondary | ICD-10-CM

## 2022-01-05 DIAGNOSIS — E038 Other specified hypothyroidism: Secondary | ICD-10-CM

## 2022-01-05 DIAGNOSIS — Z3493 Encounter for supervision of normal pregnancy, unspecified, third trimester: Secondary | ICD-10-CM

## 2022-01-05 DIAGNOSIS — Z2839 Other underimmunization status: Secondary | ICD-10-CM

## 2022-01-05 DIAGNOSIS — O26893 Other specified pregnancy related conditions, third trimester: Secondary | ICD-10-CM

## 2022-01-05 NOTE — Progress Notes (Signed)
   PRENATAL VISIT NOTE  Subjective:  Ann Hammond is a 31 y.o. G2P1001 at 79w1dbeing seen today for ongoing prenatal care.  She is currently monitored for the following issues for this low-risk pregnancy and has Hypothyroidism; PCOS (polycystic ovarian syndrome); Supervision of low-risk pregnancy; Rh negative state in antepartum period; Rubella non-immune status, antepartum; Subclinical hypothyroidism; and Polycystic ovary syndrome on their problem list.  Patient reports no bleeding, no leaking, and occasional contractions.  Contractions: Irritability. Vag. Bleeding: None.  Movement: Present. Denies leaking of fluid.   The following portions of the patient's history were reviewed and updated as appropriate: allergies, current medications, past family history, past medical history, past social history, past surgical history and problem list.   Objective:   Vitals:   01/05/22 1329  BP: 123/83  Pulse: (!) 103  Weight: 242 lb 6.4 oz (110 kg)    Fetal Status: Fetal Heart Rate (bpm): 148 Fundal Height: 39 cm Movement: Present  Presentation: Vertex  General:  Alert, oriented and cooperative. Patient is in no acute distress.  Skin: Skin is warm and dry. No rash noted.   Cardiovascular: Normal heart rate noted  Respiratory: Normal respiratory effort, no problems with respiration noted  Abdomen: Soft, gravid, appropriate for gestational age.  Pain/Pressure: Present     Pelvic: Cervical exam deferred        Extremities: Normal range of motion.  Edema: Trace  Mental Status: Normal mood and affect. Normal behavior. Normal judgment and thought content.   Assessment and Plan:  Pregnancy: G2P1001 at 355w1d. Rh negative state in antepartum period - Rho given  2. Rubella non-immune status, antepartum - MMR PP  3. Encounter for supervision of low-risk pregnancy in third trimester - Routine care  4. [redacted] weeks gestation of pregnancy   5. Subclinical hypothyroidism - Stable on  Synthroid     Preterm labor symptoms and general obstetric precautions including but not limited to vaginal bleeding, contractions, leaking of fluid and fetal movement were reviewed in detail with the patient. Please refer to After Visit Summary for other counseling recommendations.   Return for ROB.  Future Appointments  Date Time Provider [place]1/03/2022  2:35 PM NdStormy CardMD [facility]1/10/2022  4:15 PM AuMarcy SalvoMSt[facility]1/18/2024  3:15 PM [facility] NST [facility]1/18/2024  4:15 PM FoJohnston EbbsNP WM[facility]MS[facility] HillsCNM

## 2022-01-06 LAB — GC/CHLAMYDIA PROBE AMP (~~LOC~~) NOT AT ARMC
Chlamydia: NEGATIVE
Comment: NEGATIVE
Comment: NORMAL
Neisseria Gonorrhea: NEGATIVE

## 2022-01-09 LAB — CULTURE, BETA STREP (GROUP B ONLY): Strep Gp B Culture: NEGATIVE

## 2022-01-10 NOTE — L&D Delivery Note (Signed)
OB/GYN Faculty Practice Delivery Note  Ann Hammond is a 32 y.o. [id number] s/p SVD at [redacted]w[redacted]d. She was admitted for eIOL.   ROM: 0h 57m with clear fluid GBS Status: Negative Maximum Maternal Temperature: 98.1  Labor Progress: IOL begun with cytotec, then pitocin titration and AROM when complete with a bulging bag.  Delivery Date/Time: 01/28/22 at  Delivery: Called to room and patient was complete and pushing. Head delivered LOA. No nuchal cord present. >30 dystocia followed due to compound posterior hand. Swept the posterior axilla and baby rotated out easily so shoulder and body delivered. Infant with spontaneous cry, placed on mother's abdomen, dried and stimulated. Cord clamped x 2 after 1-minute delay, and cut by FOB. Cord blood drawn and cord sample collected. Placenta delivered spontaneously, intact, with 3-vessel cord. Long trailing membranes noted and delivered carefully with controlled traction using ring forceps. Swept lower uterine segment and inspected for additional membranes, none noted. Fundus firm with massage and Pitocin. Labia, perineum, vagina, and cervix inspected, 3a laceration found and repaired with 3.0 vicryl by Dr. Gifford Shave.   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

## 2022-01-12 ENCOUNTER — Ambulatory Visit (INDEPENDENT_AMBULATORY_CARE_PROVIDER_SITE_OTHER): Payer: Medicaid Other | Admitting: Family Medicine

## 2022-01-12 ENCOUNTER — Other Ambulatory Visit: Payer: Self-pay

## 2022-01-12 ENCOUNTER — Encounter: Payer: Self-pay | Admitting: Family Medicine

## 2022-01-12 VITALS — BP 123/81 | HR 116 | Wt 244.0 lb

## 2022-01-12 DIAGNOSIS — O26893 Other specified pregnancy related conditions, third trimester: Secondary | ICD-10-CM

## 2022-01-12 DIAGNOSIS — Z3A38 38 weeks gestation of pregnancy: Secondary | ICD-10-CM

## 2022-01-12 DIAGNOSIS — O09899 Supervision of other high risk pregnancies, unspecified trimester: Secondary | ICD-10-CM

## 2022-01-12 DIAGNOSIS — Z6791 Unspecified blood type, Rh negative: Secondary | ICD-10-CM

## 2022-01-12 DIAGNOSIS — Z3493 Encounter for supervision of normal pregnancy, unspecified, third trimester: Secondary | ICD-10-CM

## 2022-01-12 DIAGNOSIS — Z2839 Other underimmunization status: Secondary | ICD-10-CM

## 2022-01-12 DIAGNOSIS — O26899 Other specified pregnancy related conditions, unspecified trimester: Secondary | ICD-10-CM

## 2022-01-12 DIAGNOSIS — O09893 Supervision of other high risk pregnancies, third trimester: Secondary | ICD-10-CM

## 2022-01-12 DIAGNOSIS — E038 Other specified hypothyroidism: Secondary | ICD-10-CM

## 2022-01-12 NOTE — Progress Notes (Signed)
PRENATAL VISIT NOTE  Subjective:  Ann Hammond is a 32 y.o. G2P1001 at [redacted]w[redacted]d being seen today for ongoing prenatal care.  She is currently monitored for the following issues for this low-risk pregnancy and has Hypothyroidism; PCOS (polycystic ovarian syndrome); Supervision of low-risk pregnancy; Rh negative state in antepartum period; Rubella non-immune status, antepartum; Subclinical hypothyroidism; and Polycystic ovary syndrome on their problem list.  Patient reports no complaints.  Contractions: Irritability. Vag. Bleeding: None.  Movement: Present. Denies leaking of fluid.   The following portions of the patient's history were reviewed and updated as appropriate: allergies, current medications, past family history, past medical history, past social history, past surgical history and problem list.   Objective:   Vitals:   01/12/22 1422  BP: 123/81  Pulse: (!) 116  Weight: 244 lb (110.7 kg)    Fetal Status: Fetal Heart Rate (bpm): 149 Fundal Height: 40 cm Movement: Present     General:  Alert, oriented and cooperative. Patient is in no acute distress.  Skin: Skin is warm and dry. No rash noted.   Cardiovascular: Normal heart rate noted  Respiratory: Normal respiratory effort, no problems with respiration noted  Abdomen: Soft, gravid, appropriate for gestational age.  Pain/Pressure: Present     Pelvic: Cervical exam performed in the presence of a chaperone Dilation: 1 Effacement (%): 60 Station: -3  Extremities: Normal range of motion.  Edema: None  Mental Status: Normal mood and affect. Normal behavior. Normal judgment and thought content.   Assessment and Plan:  Pregnancy: G2P1001 at [redacted]w[redacted]d 1. Encounter for supervision of low-risk pregnancy in third trimester [redacted] weeks gestation of pregnancy Doing well overall, GBS negative.  2. Rh negative state in antepartum period Received RhoGAM on 10/30  3. Rubella non-immune status, antepartum MMR postpartum  4. Subclinical  hypothyroidism Stable on levothyroxine 25 mcg daily. This was started prior to pregnancy. Has not needed any dose changes. No new symptoms.  Last TSH 10/2021, was normal.    Term labor symptoms and general obstetric precautions including but not limited to vaginal bleeding, contractions, leaking of fluid and fetal movement were reviewed in detail with the patient. Please refer to After Visit Summary for other counseling recommendations.   Return in about 1 week (around 01/19/2022) for lob.  Future Appointments  Date Time Provider Department Center  01/19/2022  4:15 PM Autry-Lott, Simone, DO [facility]  01/27/2022  3:15 PM [facility] NST [facility]  01/27/2022  4:15 PM Forlan, Nicole, NP [facility]    Chiagoziem Ndulue MD MPH OB Fellow, Faculty Practice [place], [facility] 01/12/2022   

## 2022-01-19 ENCOUNTER — Ambulatory Visit (INDEPENDENT_AMBULATORY_CARE_PROVIDER_SITE_OTHER): Payer: Medicaid Other | Admitting: Family Medicine

## 2022-01-19 ENCOUNTER — Other Ambulatory Visit: Payer: Self-pay

## 2022-01-19 VITALS — BP 120/87 | HR 98 | Wt 245.0 lb

## 2022-01-19 DIAGNOSIS — O09899 Supervision of other high risk pregnancies, unspecified trimester: Secondary | ICD-10-CM

## 2022-01-19 DIAGNOSIS — Z3493 Encounter for supervision of normal pregnancy, unspecified, third trimester: Secondary | ICD-10-CM

## 2022-01-19 DIAGNOSIS — Z2839 Other underimmunization status: Secondary | ICD-10-CM

## 2022-01-19 DIAGNOSIS — Z6791 Unspecified blood type, Rh negative: Secondary | ICD-10-CM

## 2022-01-19 DIAGNOSIS — E039 Hypothyroidism, unspecified: Secondary | ICD-10-CM

## 2022-01-19 DIAGNOSIS — Z3A39 39 weeks gestation of pregnancy: Secondary | ICD-10-CM

## 2022-01-19 DIAGNOSIS — O26899 Other specified pregnancy related conditions, unspecified trimester: Secondary | ICD-10-CM

## 2022-01-19 NOTE — Progress Notes (Unsigned)
   PRENATAL VISIT NOTE  Subjective:  Ann Hammond is a 32 y.o. G2P1001 at [redacted]w[redacted]d being seen today for ongoing prenatal care.  She is currently monitored for the following issues for this low-risk pregnancy and has Hypothyroidism; PCOS (polycystic ovarian syndrome); Supervision of low-risk pregnancy; Rh negative state in antepartum period; Rubella non-immune status, antepartum; Subclinical hypothyroidism; and Polycystic ovary syndrome on their problem list.  Patient reports no complaints.  Contractions: Irregular. Vag. Bleeding: None.  Movement: Present. Denies leaking of fluid.   The following portions of the patient's history were reviewed and updated as appropriate: allergies, current medications, past family history, past medical history, past social history, past surgical history and problem list.   Objective:   Vitals:   01/19/22 1623  BP: 120/87  Pulse: 98  Weight: 245 lb (111.1 kg)    Fetal Status: Fetal Heart Rate (bpm): 175   Movement: Present  Presentation: Vertex  General:  Alert, oriented and cooperative. Patient is in no acute distress.  Skin: Skin is warm and dry. No rash noted.   Cardiovascular: Normal heart rate noted  Respiratory: Normal respiratory effort, no problems with respiration noted  Abdomen: Soft, gravid, appropriate for gestational age.  Pain/Pressure: Present     Pelvic: Cervical exam performed in the presence of a chaperone Dilation: 3 Effacement (%): Thick Station: -3  Extremities: Normal range of motion.     Mental Status: Normal mood and affect. Normal behavior. Normal judgment and thought content.   Assessment and Plan:  Pregnancy: G2P1001 at [redacted]w[redacted]d 1. Encounter for supervision of low-risk pregnancy in third trimester Doing well no concerns.   2. Rubella non-immune status, antepartum MMR pp  3. Rh negative state in antepartum period Rhogam work up [place]. Received rhogam 11/08/2021  4. Hypothyroidism, unspecified type Last TSH reviewed wnl.  Continue synthroid  5. [redacted] weeks gestation of pregnancy Scheduled for IOL @ 41 weeks. Consider the need for additional antenatal testing at follow up.   Term labor symptoms and general obstetric precautions including but not limited to vaginal bleeding, contractions, leaking of fluid and fetal movement were reviewed in detail with the patient. Please refer to After Visit Summary for other counseling recommendations.   Return in about 1 week (around 01/26/2022) for LROB follow up.  Future Appointments  Date Time Provider [place]  01/27/2022  3:15 PM [facility]  01/27/2022  4:15 PM Johnston Ebbs, NP [facility]    Jacarius Handel Autry-Lott, DO

## 2022-01-20 ENCOUNTER — Encounter: Payer: Self-pay | Admitting: General Practice

## 2022-01-21 NOTE — Addendum Note (Signed)
Addended by: Caralee Ates on: 01/21/2022 11:27 AM   Modules accepted: Orders

## 2022-01-26 ENCOUNTER — Other Ambulatory Visit: Payer: Self-pay | Admitting: Advanced Practice Midwife

## 2022-01-26 ENCOUNTER — Encounter (HOSPITAL_COMMUNITY): Payer: Self-pay | Admitting: *Deleted

## 2022-01-26 ENCOUNTER — Telehealth (HOSPITAL_COMMUNITY): Payer: Self-pay | Admitting: *Deleted

## 2022-01-26 ENCOUNTER — Other Ambulatory Visit (HOSPITAL_COMMUNITY): Payer: Self-pay | Admitting: *Deleted

## 2022-01-26 NOTE — Telephone Encounter (Signed)
Preadmission screen  

## 2022-01-27 ENCOUNTER — Other Ambulatory Visit: Payer: Self-pay

## 2022-01-27 ENCOUNTER — Ambulatory Visit (INDEPENDENT_AMBULATORY_CARE_PROVIDER_SITE_OTHER): Payer: Medicaid Other | Admitting: Student

## 2022-01-27 ENCOUNTER — Ambulatory Visit: Payer: Medicaid Other | Admitting: *Deleted

## 2022-01-27 VITALS — BP 137/76 | HR 106 | Wt 247.1 lb

## 2022-01-27 DIAGNOSIS — Z3A40 40 weeks gestation of pregnancy: Secondary | ICD-10-CM | POA: Diagnosis not present

## 2022-01-27 DIAGNOSIS — Z6791 Unspecified blood type, Rh negative: Secondary | ICD-10-CM

## 2022-01-27 DIAGNOSIS — O480 Post-term pregnancy: Secondary | ICD-10-CM | POA: Diagnosis not present

## 2022-01-27 DIAGNOSIS — Z2839 Other underimmunization status: Secondary | ICD-10-CM

## 2022-01-27 DIAGNOSIS — E039 Hypothyroidism, unspecified: Secondary | ICD-10-CM

## 2022-01-27 DIAGNOSIS — O09893 Supervision of other high risk pregnancies, third trimester: Secondary | ICD-10-CM

## 2022-01-27 DIAGNOSIS — Z3493 Encounter for supervision of normal pregnancy, unspecified, third trimester: Secondary | ICD-10-CM

## 2022-01-27 DIAGNOSIS — O09899 Supervision of other high risk pregnancies, unspecified trimester: Secondary | ICD-10-CM

## 2022-01-27 DIAGNOSIS — O26899 Other specified pregnancy related conditions, unspecified trimester: Secondary | ICD-10-CM

## 2022-01-27 NOTE — Progress Notes (Signed)
Patient states that she thinks she lost mucous plug after last week cervical check, the next day states she is still continuing to have mucous like discharge. States it is white/yellow in color.   Ann Hammond, CMA

## 2022-01-27 NOTE — Progress Notes (Signed)
   PRENATAL VISIT NOTE  Subjective:  Ann Hammond is a 32 y.o. G2P1001 at [redacted]w[redacted]d being seen today for ongoing prenatal care.  She is currently monitored for the following issues for this low-risk pregnancy and has Hypothyroidism; PCOS (polycystic ovarian syndrome); Supervision of low-risk pregnancy; Rh negative state in antepartum period; Rubella non-immune status, antepartum; Subclinical hypothyroidism; and Polycystic ovary syndrome on their problem list.  Patient reports occasional contractions.  Contractions: Irregular. Vag. Bleeding: None.  Movement: Present. Denies leaking of fluid.   The following portions of the patient's history were reviewed and updated as appropriate: allergies, current medications, past family history, past medical history, past social history, past surgical history and problem list.   Objective:   Vitals:   01/27/22 1521  BP: 137/76  Pulse: (!) 106  Weight: 247 lb 1.6 oz (112.1 kg)    Fetal Status: Fetal Heart Rate (bpm): 156   Movement: Present     General:  Alert, oriented and cooperative. Patient is in no acute distress.  Skin: Skin is warm and dry. No rash noted.   Cardiovascular: Normal heart rate noted  Respiratory: Normal respiratory effort, no problems with respiration noted  Abdomen: Soft, gravid, appropriate for gestational age.  Pain/Pressure: Present     Pelvic: Cervical exam performed in the presence of a chaperone Dilation: 3 Effacement (%): 30, 40 Station: -2  Extremities: Normal range of motion.     Mental Status: Normal mood and affect. Normal behavior. Normal judgment and thought content.   Assessment and Plan:  Pregnancy: G2P1001 at [redacted]w[redacted]d  1. Encounter for supervision of low-risk pregnancy in third trimester 2. [redacted] weeks gestation of pregnancy - Frequent fetal movement - IOL re-scheduled for this evening - NST reactive   3. Rubella non-immune status, antepartum - Manage PP  4. Rh negative state in antepartum period - rhogam  postpartum  5. Hypothyroidism, unspecified type - continue synthroid   Term labor symptoms and general obstetric precautions including but not limited to vaginal bleeding, contractions, leaking of fluid and fetal movement were reviewed in detail with the patient. Please refer to After Visit Summary for other counseling recommendations.   No follow-ups on file.  Future Appointments  Date Time Provider [place]  01/27/2022  4:15 PM Johnston Ebbs, NP [facility]  01/28/2022 12:00 AM [facility] [place] None    Johnston Ebbs, NP

## 2022-01-28 ENCOUNTER — Inpatient Hospital Stay (HOSPITAL_COMMUNITY): Payer: Medicaid Other | Admitting: Anesthesiology

## 2022-01-28 ENCOUNTER — Inpatient Hospital Stay (HOSPITAL_COMMUNITY): Payer: Medicaid Other

## 2022-01-28 ENCOUNTER — Encounter (HOSPITAL_COMMUNITY): Payer: Self-pay | Admitting: Family Medicine

## 2022-01-28 ENCOUNTER — Inpatient Hospital Stay (HOSPITAL_COMMUNITY)
Admission: RE | Admit: 2022-01-28 | Discharge: 2022-01-29 | DRG: 768 | Disposition: A | Discharge status: Home / Self Care | Payer: Medicaid Other | Attending: Family Medicine | Admitting: Family Medicine

## 2022-01-28 ENCOUNTER — Other Ambulatory Visit: Payer: Self-pay

## 2022-01-28 DIAGNOSIS — E039 Hypothyroidism, unspecified: Secondary | ICD-10-CM | POA: Diagnosis present

## 2022-01-28 DIAGNOSIS — Z3A40 40 weeks gestation of pregnancy: Secondary | ICD-10-CM

## 2022-01-28 DIAGNOSIS — O480 Post-term pregnancy: Secondary | ICD-10-CM | POA: Diagnosis present

## 2022-01-28 DIAGNOSIS — O26899 Other specified pregnancy related conditions, unspecified trimester: Principal | ICD-10-CM

## 2022-01-28 DIAGNOSIS — Z87891 Personal history of nicotine dependence: Secondary | ICD-10-CM

## 2022-01-28 DIAGNOSIS — E282 Polycystic ovarian syndrome: Secondary | ICD-10-CM

## 2022-01-28 DIAGNOSIS — Z3493 Encounter for supervision of normal pregnancy, unspecified, third trimester: Secondary | ICD-10-CM

## 2022-01-28 DIAGNOSIS — O99214 Obesity complicating childbirth: Secondary | ICD-10-CM | POA: Diagnosis present

## 2022-01-28 DIAGNOSIS — O09899 Supervision of other high risk pregnancies, unspecified trimester: Secondary | ICD-10-CM

## 2022-01-28 DIAGNOSIS — Z3A39 39 weeks gestation of pregnancy: Secondary | ICD-10-CM

## 2022-01-28 DIAGNOSIS — O99284 Endocrine, nutritional and metabolic diseases complicating childbirth: Secondary | ICD-10-CM | POA: Diagnosis present

## 2022-01-28 DIAGNOSIS — O326XX1 Maternal care for compound presentation, fetus 1: Secondary | ICD-10-CM | POA: Diagnosis not present

## 2022-01-28 LAB — TYPE AND SCREEN
ABO/RH(D): B NEG
Antibody Screen: POSITIVE

## 2022-01-28 LAB — RPR: RPR Ser Ql: NONREACTIVE

## 2022-01-28 LAB — CBC
HCT: 35.9 % — ABNORMAL LOW (ref 36.0–46.0)
Hemoglobin: 12.3 g/dL (ref 12.0–15.0)
MCH: 29.8 pg (ref 26.0–34.0)
MCHC: 34.3 g/dL (ref 30.0–36.0)
MCV: 86.9 fL (ref 80.0–100.0)
Platelets: 178 10*3/uL (ref 150–400)
RBC: 4.13 MIL/uL (ref 3.87–5.11)
RDW: 13.3 % (ref 11.5–15.5)
WBC: 8.4 10*3/uL (ref 4.0–10.5)
nRBC: 0 % (ref 0.0–0.2)

## 2022-01-28 MED ORDER — FENTANYL CITRATE (PF) 100 MCG/2ML IJ SOLN: 50.0000 ug | INTRAMUSCULAR | Status: DC | PRN

## 2022-01-28 MED ORDER — ZOLPIDEM TARTRATE 5 MG PO TABS: 5.0000 mg | ORAL_TABLET | Freq: Every evening | ORAL | Status: DC | PRN

## 2022-01-28 MED ORDER — EPHEDRINE 5 MG/ML INJ: 10.0000 mg | INTRAVENOUS | Status: DC | PRN

## 2022-01-28 MED ORDER — OXYTOCIN-SODIUM CHLORIDE 30-0.9 UT/500ML-% IV SOLN
1.0000 m[IU]/min | INTRAVENOUS | Status: DC
  Administered 2022-01-28: 2 m[IU]/min via INTRAVENOUS
  Filled 2022-01-28: qty 500

## 2022-01-28 MED ORDER — METHYLERGONOVINE MALEATE 0.2 MG PO TABS
0.2000 mg | ORAL_TABLET | Freq: Four times a day (QID) | ORAL | Status: DC
  Administered 2022-01-28 – 2022-01-29 (×3): 0.2 mg via ORAL
  Filled 2022-01-28 (×3): qty 1

## 2022-01-28 MED ORDER — SOD CITRATE-CITRIC ACID 500-334 MG/5ML PO SOLN: 30.0000 mL | ORAL | Status: DC | PRN

## 2022-01-28 MED ORDER — TERBUTALINE SULFATE 1 MG/ML IJ SOLN: 0.2500 mg | Freq: Once | INTRAMUSCULAR | Status: DC | PRN

## 2022-01-28 MED ORDER — SIMETHICONE 80 MG PO CHEW: 80.0000 mg | CHEWABLE_TABLET | ORAL | Status: DC | PRN

## 2022-01-28 MED ORDER — DIBUCAINE (PERIANAL) 1 % EX OINT: 1.0000 | TOPICAL_OINTMENT | CUTANEOUS | Status: DC | PRN

## 2022-01-28 MED ORDER — PHENYLEPHRINE 80 MCG/ML (10ML) SYRINGE FOR IV PUSH (FOR BLOOD PRESSURE SUPPORT)
80.0000 ug | PREFILLED_SYRINGE | INTRAVENOUS | Status: DC | PRN
  Filled 2022-01-28: qty 10

## 2022-01-28 MED ORDER — ONDANSETRON HCL 4 MG/2ML IJ SOLN: 4.0000 mg | INTRAMUSCULAR | Status: DC | PRN

## 2022-01-28 MED ORDER — DIPHENHYDRAMINE HCL 50 MG/ML IJ SOLN: 12.5000 mg | INTRAMUSCULAR | Status: DC | PRN

## 2022-01-28 MED ORDER — OXYCODONE HCL 5 MG PO TABS: 5.0000 mg | ORAL_TABLET | ORAL | Status: DC | PRN

## 2022-01-28 MED ORDER — METHYLERGONOVINE MALEATE 0.2 MG/ML IJ SOLN
0.2000 mg | Freq: Once | INTRAMUSCULAR | Status: AC
  Administered 2022-01-28: 0.2 mg via INTRAMUSCULAR
  Filled 2022-01-28: qty 1

## 2022-01-28 MED ORDER — LEVOTHYROXINE SODIUM 25 MCG PO TABS
25.0000 ug | ORAL_TABLET | Freq: Once | ORAL | Status: AC
  Administered 2022-01-28: 25 ug via ORAL
  Filled 2022-01-28: qty 1

## 2022-01-28 MED ORDER — ONDANSETRON HCL 4 MG/2ML IJ SOLN: 4.0000 mg | Freq: Four times a day (QID) | INTRAMUSCULAR | Status: DC | PRN

## 2022-01-28 MED ORDER — LIDOCAINE HCL (PF) 1 % IJ SOLN
INTRAMUSCULAR | Status: DC | PRN
  Administered 2022-01-28 (×2): 4 mL via EPIDURAL

## 2022-01-28 MED ORDER — SENNOSIDES-DOCUSATE SODIUM 8.6-50 MG PO TABS
2.0000 | ORAL_TABLET | Freq: Every day | ORAL | Status: DC
  Administered 2022-01-29: 2 via ORAL
  Filled 2022-01-28: qty 2

## 2022-01-28 MED ORDER — BENZOCAINE-MENTHOL 20-0.5 % EX AERO
1.0000 | INHALATION_SPRAY | CUTANEOUS | Status: DC | PRN
  Administered 2022-01-28: 1 via TOPICAL
  Filled 2022-01-28: qty 56

## 2022-01-28 MED ORDER — LACTATED RINGERS IV SOLN: 500.0000 mL | INTRAVENOUS | Status: DC | PRN

## 2022-01-28 MED ORDER — PRENATAL MULTIVITAMIN CH
1.0000 | ORAL_TABLET | Freq: Every day | ORAL | Status: DC
  Administered 2022-01-29: 1 via ORAL
  Filled 2022-01-28: qty 1

## 2022-01-28 MED ORDER — LACTATED RINGERS IV SOLN: INTRAVENOUS | Status: DC

## 2022-01-28 MED ORDER — LACTATED RINGERS IV SOLN: 500.0000 mL | Freq: Once | INTRAVENOUS | Status: DC

## 2022-01-28 MED ORDER — MISOPROSTOL 50MCG HALF TABLET
50.0000 ug | ORAL_TABLET | Freq: Once | ORAL | Status: AC
  Administered 2022-01-28: 50 ug via ORAL
  Filled 2022-01-28: qty 1

## 2022-01-28 MED ORDER — OXYTOCIN-SODIUM CHLORIDE 30-0.9 UT/500ML-% IV SOLN: 1.0000 m[IU]/min | INTRAVENOUS | Status: DC

## 2022-01-28 MED ORDER — LIDOCAINE HCL (PF) 1 % IJ SOLN: 30.0000 mL | INTRAMUSCULAR | Status: DC | PRN

## 2022-01-28 MED ORDER — WITCH HAZEL-GLYCERIN EX PADS: 1.0000 | MEDICATED_PAD | CUTANEOUS | Status: DC | PRN

## 2022-01-28 MED ORDER — PHENYLEPHRINE 80 MCG/ML (10ML) SYRINGE FOR IV PUSH (FOR BLOOD PRESSURE SUPPORT): 80.0000 ug | PREFILLED_SYRINGE | INTRAVENOUS | Status: DC | PRN

## 2022-01-28 MED ORDER — OXYCODONE HCL 5 MG PO TABS: 10.0000 mg | ORAL_TABLET | ORAL | Status: DC | PRN

## 2022-01-28 MED ORDER — ONDANSETRON HCL 4 MG PO TABS: 4.0000 mg | ORAL_TABLET | ORAL | Status: DC | PRN

## 2022-01-28 MED ORDER — ACETAMINOPHEN 325 MG PO TABS: 650.0000 mg | ORAL_TABLET | ORAL | Status: DC | PRN

## 2022-01-28 MED ORDER — FENTANYL-BUPIVACAINE-NACL 0.5-0.125-0.9 MG/250ML-% EP SOLN
12.0000 mL/h | EPIDURAL | Status: DC | PRN
  Administered 2022-01-28: 12 mL/h via EPIDURAL
  Filled 2022-01-28: qty 250

## 2022-01-28 MED ORDER — COCONUT OIL OIL: 1.0000 | TOPICAL_OIL | Status: DC | PRN

## 2022-01-28 MED ORDER — DIPHENHYDRAMINE HCL 25 MG PO CAPS: 25.0000 mg | ORAL_CAPSULE | Freq: Four times a day (QID) | ORAL | Status: DC | PRN

## 2022-01-28 MED ORDER — IBUPROFEN 600 MG PO TABS
600.0000 mg | ORAL_TABLET | Freq: Four times a day (QID) | ORAL | Status: DC
  Administered 2022-01-28 – 2022-01-29 (×4): 600 mg via ORAL
  Filled 2022-01-28 (×3): qty 1

## 2022-01-28 NOTE — Progress Notes (Signed)
Patient ID: Ann Hammond, female   DOB: 09/02/1990, 32 y.o.   MRN: [id number]  Labor Progress Note Ann Hammond is a 32 y.o. G2P1001 at [redacted]w[redacted]d presented for elective IOL  S:  Pt now comfortable with epidural, feeling some pressure.  O:  BP 128/85   Pulse (!) 107   Temp 98.1 F (36.7 C) (Oral)   Resp 18   Ht 5\' 6"  (1.676 m)   Wt 248 lb 1.6 oz (112.5 kg)   LMP 04/20/2021 (Exact Date)   SpO2 98%   BMI 40.04 kg/m  EFM: baseline 140 bpm/ moderate variability/ 15x15 accels/ no decels  Toco/IUPC: q2-9min SVE: Dilation: 10 Dilation Complete Date: 01/28/22 Dilation Complete Time: 1347 Effacement (%): 80 Station: -1 Presentation: Vertex Exam by:: Aaleah Hirsch, CNM Pitocin: 46mu/min  A/P: 32 y.o. G2P1001 [redacted]w[redacted]d  1. Labor: Transitional stage, will monitor closely for urge to push/increased pressure. 2. FWB: Cat 1 3. Pain: Well-controlled with epidural  Anticipate SVD.  Gabriel Carina, CNM 1:54 PM

## 2022-01-28 NOTE — Progress Notes (Signed)
Patient ID: [name], female   DOB: Aug 27, 1990, 32 y.o.   MRN: [id number]  Labor Progress Note Ann Hammond is a 32 y.o. G2P1001 at [redacted]w[redacted]d presented for elective IOL  S:  Pt feeling more uncomfortable with contractions, planning to take a walk and then get her epidural.  O:  BP 100/64   Pulse 88   Temp 98.1 F (36.7 C) (Oral)   Resp 18   Ht 5\' 6"  (1.676 m)   Wt 248 lb 1.6 oz (112.5 kg)   LMP 04/20/2021 (Exact Date)   SpO2 98%   BMI 40.04 kg/m  EFM: baseline 140 bpm/ moderate variability/ 15x15 accels/ no decels  Toco/IUPC: q2-69min SVE: Dilation: 4.5 Effacement (%): 80 Station: -2 Presentation: Vertex Exam by:: J.Cox, RN Pitocin: 40mu/min  A/P: 32 y.o. G2P1001 [redacted]w[redacted]d  1. Labor: Progressing well with Pitocin titration 2. FWB: Cat 1 3. Pain: Well-tolerated, planning epidural  Continue to titrate Pitocin, will AROM after epidural is placed. Anticipate SVD.  Gabriel Carina, CNM 11:21 AM

## 2022-01-28 NOTE — H&P (Signed)
Ann Hammond is a 32 y.o. female G2P1001 with IUP at [redacted]w[redacted]d presenting for IOL for elective at term. [facility] at [facility]  Prenatal History/Complications:   Term SVD 3605gm w/o problems 10 years ago  Past Medical History: Past Medical History:  Diagnosis Date   Hypothyroidism    No pertinent past medical history     Past Surgical History: Past Surgical History:  Procedure Laterality Date   APPENDECTOMY      Obstetrical History: OB History     Gravida  2   Para  1   Term  1   Preterm      AB      Living  1      SAB      IAB      Ectopic      Multiple      Live Births  1           Social History: Social History   Socioeconomic History   Marital status: Married    Spouse name: Not on file   Number of children: Not on file   Years of education: Not on file   Highest education level: Not on file  Occupational History   Occupation: homemaker  Tobacco Use   Smoking status: Former    Types: Cigarettes    Quit date: 03/19/2010    Years since quitting: 11.8   Smokeless tobacco: Never  Vaping Use   Vaping Use: Never used  Substance and Sexual Activity   Alcohol use: Yes    Comment: occ   Drug use: No   Sexual activity: Yes  Other Topics Concern   Not on file  Social History Narrative   Not on file   Social Determinants of Health   Financial Resource Strain: Not on file  Food Insecurity: No Food Insecurity (01/28/2022)   Hunger Vital Sign    Worried About Running Out of Food in the Last Year: Never true    Ran Out of Food in the Last Year: Never true  Transportation Needs: No Transportation Needs (09/09/2021)   PRAPARE - Hydrologist (Medical): No    Lack of Transportation (Non-Medical): No  Physical Activity: Not on file  Stress: Not on file  Social Connections: Not on file    Family History: Family History  Problem Relation Age of Onset   Hypertension Father    CAD Father    Hyperlipidemia Maternal  Grandmother    Diabetes Maternal Grandmother    Lung cancer Maternal Grandfather    Pancreatic cancer Paternal Grandmother    Diabetes Other    Anesthesia problems Neg Hx    Malignant hyperthermia Neg Hx    Pseudochol deficiency Neg Hx    Hypotension Neg Hx     Allergies: No Known Allergies  Medications Prior to Admission  Medication Sig Dispense Refill Last Dose   acetaminophen (TYLENOL) 500 MG tablet Take 1,000 mg by mouth every 6 (six) hours as needed. For pain      cholecalciferol (VITAMIN D3) 25 MCG (1000 UNIT) tablet Take 1,000 Units by mouth daily.      famotidine (PEPCID) 20 MG tablet Take 1 tablet (20 mg total) by mouth 2 (two) times daily. 30 tablet 3    levothyroxine (SYNTHROID) 25 MCG tablet Take 1 tablet (25 mcg total) by mouth daily before breakfast. 90 tablet 2    Prenatal Vit-Fe Fumarate-FA (PRENATAL MULTIVITAMIN) TABS Take 1 tablet by mouth daily.  Psyllium (METAMUCIL PO) Take by mouth. (Patient not taking: Reported on 01/19/2022)           Review of Systems   Constitutional: Negative for fever and chills Eyes: Negative for visual disturbances Respiratory: Negative for shortness of breath, dyspnea Cardiovascular: Negative for chest pain or palpitations  Gastrointestinal: Negative for abdominal pain, vomiting, diarrhea and constipation.   Genitourinary: Negative for dysuria and urgency Musculoskeletal: Negative for back pain, joint pain, myalgias  Neurological: Negative for dizziness and headaches      Blood pressure (!) 131/93, pulse (!) 102, temperature 98.1 F (36.7 C), temperature source Oral, resp. rate 16, height 5\' 6"  (1.676 m), weight 112.5 kg, last menstrual period 04/20/2021. General appearance: alert, cooperative, and no distress Lungs: normal respiratory effort Heart: regular rate and rhythm Abdomen: soft, non-tender; bowel sounds normal Extremities: Homans sign is negative, no sign of DVT DTR's 2+ Presentation: cephalic Fetal  monitoring  Baseline: 135 bpm, Variability: Good {> 6 bpm), Accelerations: Reactive, and Decelerations: Absent Uterine activity  rare      Prenatal labs: ABO, Rh: --/--/PENDING (01/19 0046) Antibody: PENDING (01/19 0046) Rubella: immune RPR: Non Reactive (10/30 0807)  HBsAg: Negative (07/03 1616)  HIV: Non Reactive (10/30 0807)  GBS: Negative/-- (12/27 1453)   Nursing Staff Provider  Office Location [facility] Dating  01/25/2022, by Last Menstrual Period  [facility] Model [x]  Traditional [ ]  Centering [ ]  Mom-Baby Dyad Anatomy US  Nml  Language  English    Flu Vaccine  11/08/21 Genetic/Carrier Screen  NIPS: Nml female   AFP:   Neg Horizon: Nml  TDaP Vaccine   11/08/21 Hgb A1C or  GTT Early  Third trimester: Nml  COVID Vaccine    LAB RESULTS   Rhogam  11/08/21 Blood Type B/Negative/-- (07/03 1616)   Baby Feeding Plan Both Antibody Negative (10/30 0807)  Contraception Declined Rubella <0.90 (07/03 1616)  Circumcision NA RPR Non Reactive (10/30 0807)   Pediatrician  [facility] HBsAg Negative (07/03 1616)   Support Person  HCVAb Non Reactive (07/03 1616)   Prenatal Classes  HIV Non Reactive (10/30 0807)     BTL Consent NA GBS  Neg  VBAC Consent NA Pap No results found for: "DIAGPAP"       DME Rx [ ]  BP cuff [ ]  Weight Scale Waterbirth  [ ]  Class [ ]  Consent [ ]  CNM visit  PHQ9 & GAD7 [  ] new OB [  ] 28 weeks  [  ] 36 weeks Induction  [ ]  Orders Entered [ ] Foley Y/N    Prenatal Transfer Tool  Maternal Diabetes: No Genetic Screening: Normal Maternal Ultrasounds/Referrals: Normal Fetal Ultrasounds or other Referrals:  None Maternal Substance Abuse:  No Significant Maternal Medications:  None Significant Maternal Lab Results: Group B Strep negative    Results for orders placed or performed during the hospital encounter of 01/28/22 (from the past 24 hour(s))  Type and screen   Collection Time: 01/28/22 12:46 AM  Result Value Ref Range   ABO/RH(D) PENDING     Antibody Screen PENDING    Sample Expiration      01/31/2022,2359 Performed at [facility], [place] [address] [place], [place] [address]     Assessment: Ann Hammond is a 32 y.o. G2P1001 with an IUP at [redacted]w[redacted]d presenting for IOL for elective.  Plan: #Labor: Cytotec->pitocin #Pain:  Per request #FWB Cat 1 #ID: GBS: neg  #MOF:  both #MOC: declines  Christin Fudge 01/28/2022, 1:02  AM    

## 2022-01-28 NOTE — Anesthesia Preprocedure Evaluation (Signed)
Anesthesia Evaluation  Patient identified by MRN, date of birth, ID band Patient awake    Reviewed: Allergy & Precautions, Patient's Chart, lab work & pertinent test results  History of Anesthesia Complications Negative for: history of anesthetic complications  Airway Mallampati: II  TM Distance: >3 FB Neck ROM: Full    Dental no notable dental hx.    Pulmonary former smoker   Pulmonary exam normal        Cardiovascular negative cardio ROS Normal cardiovascular exam     Neuro/Psych negative neurological ROS  negative psych ROS   GI/Hepatic negative GI ROS, Neg liver ROS,,,  Endo/Other  Hypothyroidism  Morbid obesity  Renal/GU negative Renal ROS  negative genitourinary   Musculoskeletal negative musculoskeletal ROS (+)    Abdominal   Peds  Hematology negative hematology ROS (+)   Anesthesia Other Findings Day of surgery medications reviewed with patient.  Reproductive/Obstetrics (+) Pregnancy                              Anesthesia Physical Anesthesia Plan  ASA: 3  Anesthesia Plan: Epidural   Post-op Pain Management:    Induction:   PONV Risk Score and Plan: Treatment may vary due to age or medical condition  Airway Management Planned: Natural Airway  Additional Equipment: Fetal Monitoring  Intra-op Plan:   Post-operative Plan:   Informed Consent: I have reviewed the patients History and Physical, chart, labs and discussed the procedure including the risks, benefits and alternatives for the proposed anesthesia with the patient or authorized representative who has indicated his/her understanding and acceptance.       Plan Discussed with:   Anesthesia Plan Comments:          Anesthesia Quick Evaluation

## 2022-01-28 NOTE — Discharge Summary (Signed)
Postpartum Discharge Summary    Patient Name: Ann Hammond DOB: 1990/04/17 MRN: [id number]  Date of admission: 01/28/2022 Delivery date:01/28/2022  Delivering provider: Gaylan Gerold R  Date of discharge: 01/29/2022  Admitting diagnosis: Indication for care in labor and delivery, antepartum [O75.9] Intrauterine pregnancy: [redacted]w[redacted]d     Secondary diagnosis:  Principal Problem:   Indication for care in labor and delivery, antepartum Active Problems:   Third degree laceration of perineum during delivery, postpartum  Additional problems: 3rd degree laceration    Discharge diagnosis: Term Pregnancy Delivered                                              Post partum procedures: n/a Augmentation: AROM, Pitocin, and Cytotec Complications: Trailing membranes, slow bleeding after delivery - methergine given + methergine series  Hospital course: Onset of Labor With Vaginal Delivery      32 y.o. yo [id number] at [redacted]w[redacted]d was admitted in Latent Labor on 01/28/2022. Labor course was complicated by trailing membranes after placenta with large gushes (but not continued bleeding) with initial fundal rubs. Methergine given 1-2hrs after delivery, ordered methergine series out of concern for retained membranes (two lower uterine sweeps did not produce further membranes once initially out).  Membrane Rupture Time/Date: 1:47 PM ,01/28/2022   Delivery Method:Vaginal, Spontaneous  Episiotomy: None  Lacerations:  3rd degree  Patient had a postpartum course complicated by postpartum bleeding.  She is ambulating, tolerating a regular diet, passing flatus, and urinating well. Patient is discharged home in stable condition on 01/29/22.  Newborn Data: Birth date:01/28/2022  Birth time:2:31 PM  Gender:Female  Living status:Living  Apgars:8 ,9  Weight:4300 g   Magnesium Sulfate received: No BMZ received: No Rhophylac:N/A MMR:N/A T-DaP:Given prenatally Flu: No Transfusion:No  Physical exam  Vitals:    01/28/22 1823 01/28/22 2324 01/29/22 0355 01/29/22 0755  BP: 134/81 127/75 129/82 126/73  Pulse: 97 93  84  Resp: 18 18 18 18   Temp: 98.1 F (36.7 C) 98.4 F (36.9 C) 98 F (36.7 C) 98.4 F (36.9 C)  TempSrc: Oral Oral Oral Oral  SpO2:  96% 98% 100%  Weight:      Height:       General: alert, cooperative, and no distress Lochia: appropriate Uterine Fundus: firm Incision: N/A DVT Evaluation: No evidence of DVT seen on physical exam. Labs: Lab Results  Component Value Date   WBC 11.7 (H) 01/29/2022   HGB 11.7 (L) 01/29/2022   HCT 35.5 (L) 01/29/2022   MCV 90.1 01/29/2022   PLT 162 01/29/2022      Latest Ref Rng & Units 02/14/2009    6:10 PM  CMP  Glucose 70 - 99 mg/dL 92   BUN 6 - 23 mg/dL 12   Creatinine 0.4 - 1.2 mg/dL 0.7   Sodium 135 - 145 mEq/L 141   Potassium 3.5 - 5.1 mEq/L 4.4   Chloride 96 - 112 mEq/L 104   CO2 19 - 32 mEq/L 28   Calcium 8.4 - 10.5 mg/dL 9.7    Edinburgh Score:     No data to display           After visit meds:  Allergies as of 01/29/2022   No Known Allergies      Medication List     TAKE these medications    famotidine 20 MG tablet Commonly known  as: Pepcid Take 1 tablet (20 mg total) by mouth 2 (two) times daily.   ibuprofen 600 MG tablet Commonly known as: ADVIL Take 1 tablet (600 mg total) by mouth every 6 (six) hours.   levothyroxine 25 MCG tablet Commonly known as: SYNTHROID Take 1 tablet (25 mcg total) by mouth daily before breakfast.   prenatal multivitamin Tabs tablet Take 1 tablet by mouth daily.   senna-docusate 8.6-50 MG tablet Commonly known as: Senokot-S Take 2 tablets by mouth daily.   Tums 500 MG chewable tablet Generic drug: calcium carbonate Chew 1-2 tablets by mouth 3 (three) times daily as needed for indigestion or heartburn.   TYLENOL 500 MG tablet Generic drug: acetaminophen Take 1,000 mg by mouth every 6 (six) hours as needed (for pain).   Vitamin D3 50 MCG (2000 UT) Tabs Take 2,000  Units by mouth daily.         Discharge home in stable condition Infant Feeding: Bottle and Breast Infant Disposition:home with mother Discharge instruction: per After Visit Summary and Postpartum booklet. Activity: Advance as tolerated. Pelvic rest for 6 weeks.  Diet: routine diet  Future Appointments:No future appointments.  Follow up Visit:  [place] [facility] [place] [facility] Follow up.   Specialty: Obstetrics and Gynecology Why: In 1 week for laceration check and 4 weeks for postpartum appointment Contact information: [place] [id number] [phone]               Message sent to [place]  by Gaylan Gerold, CNM Please schedule this patient for a In person postpartum visit in 4 weeks with the following provider: Any provider. Additional Postpartum F/U: None   Low risk pregnancy complicated by:  none Delivery mode:  Vaginal, Spontaneous  Anticipated Birth Control:  Unsure  Updated messages sent for 1 week check of laceration 1/20  01/29/2022 Wende Mott, CNM

## 2022-01-28 NOTE — Anesthesia Procedure Notes (Signed)
Epidural Patient location during procedure: OB Start time: 01/28/2022 9:47 AM End time: 01/28/2022 9:51 AM  Staffing Anesthesiologist: Brennan Bailey, MD Performed: anesthesiologist   Preanesthetic Checklist Completed: patient identified, IV checked, risks and benefits discussed, monitors and equipment checked, pre-op evaluation and timeout performed  Epidural Patient position: sitting Prep: DuraPrep and site prepped and draped Patient monitoring: continuous pulse ox, blood pressure and heart rate Approach: midline Location: L3-L4 Injection technique: LOR air  Needle:  Needle type: Tuohy  Needle gauge: 17 G Needle length: 9 cm Needle insertion depth: 6 cm Catheter type: closed end flexible Catheter size: 19 Gauge Catheter at skin depth: 10 cm Test dose: negative and Other (1% lidocaine)  Assessment Events: blood not aspirated, no cerebrospinal fluid, injection not painful, no injection resistance, no paresthesia and negative IV test  Additional Notes Patient identified. Risks, benefits, and alternatives discussed with patient including but not limited to bleeding, infection, nerve damage, paralysis, failed block, incomplete pain control, headache, blood pressure changes, nausea, vomiting, reactions to medication, itching, and postpartum back pain. Confirmed with bedside nurse the patient's most recent platelet count. Confirmed with patient that they are not currently taking any anticoagulation, have any bleeding history, or any family history of bleeding disorders. Patient expressed understanding and wished to proceed. All questions were answered. Sterile technique was used throughout the entire procedure. Please see nursing notes for vital signs.   Crisp LOR on first pass. Test dose was given through epidural catheter and negative prior to continuing to dose epidural or start infusion. Warning signs of high block given to the patient including shortness of breath,  tingling/numbness in hands, complete motor block, or any concerning symptoms with instructions to call for help. Patient was given instructions on fall risk and not to get out of bed. All questions and concerns addressed with instructions to call with any issues or inadequate analgesia.  Reason for block:procedure for pain

## 2022-01-29 LAB — CBC
HCT: 35.5 % — ABNORMAL LOW (ref 36.0–46.0)
Hemoglobin: 11.7 g/dL — ABNORMAL LOW (ref 12.0–15.0)
MCH: 29.7 pg (ref 26.0–34.0)
MCHC: 33 g/dL (ref 30.0–36.0)
MCV: 90.1 fL (ref 80.0–100.0)
Platelets: 162 10*3/uL (ref 150–400)
RBC: 3.94 MIL/uL (ref 3.87–5.11)
RDW: 13.5 % (ref 11.5–15.5)
WBC: 11.7 10*3/uL — ABNORMAL HIGH (ref 4.0–10.5)
nRBC: 0 % (ref 0.0–0.2)

## 2022-01-29 MED ORDER — RHO D IMMUNE GLOBULIN 1500 UNIT/2ML IJ SOSY
300.0000 ug | PREFILLED_SYRINGE | Freq: Once | INTRAMUSCULAR | Status: AC
  Administered 2022-01-29: 300 ug via INTRAVENOUS
  Filled 2022-01-29: qty 2

## 2022-01-29 MED ORDER — IBUPROFEN 600 MG PO TABS: 600.0000 mg | ORAL_TABLET | Freq: Four times a day (QID) | ORAL | 0 refills | Status: DC

## 2022-01-29 MED ORDER — SENNOSIDES-DOCUSATE SODIUM 8.6-50 MG PO TABS: 2.0000 | ORAL_TABLET | Freq: Every day | ORAL | 0 refills | Status: AC

## 2022-01-29 NOTE — Lactation Note (Signed)
This note was copied from a baby's chart. Lactation Consultation Note  Patient Name: Ann Hammond EHMCN'O Date: 01/29/2022 Reason for consult: Initial assessment;Term Age:33 hours   P2: Term infant at 40+3 weeks Feeding preference: Breast milk (Pump and bottle feed)                                  Mother is using formula until her milk comes to volume  Mother has not yet been set up with an electric pump.  Offered to assist with pump initiation; mother receptive.  Reviewed pump parts, assembly and cleaning.  #21 flanges are slightly large, however, mother denies discomfort with pumping.  Discussed possibly needing to order #19 flanges for her Medela pump if she experiences discomfort with pumping.  After pumping for 15 minutes mother was able to obtain 1 ml of colostrum.  Demonstrated feeding with the curved tip syringe and "[name]" consumed this with ease.  Discussed milk storage times and feeding expressed milk prior to giving formula supplementation.  Provided the bottle feeding supplementation guidelines for parents.  Mother stated that she has had to feed more than the minimum to keep "[name]" content.  Baby has been content after 25 mls; no spitting noted.  Father present.   Maternal Data Has patient been taught Hand Expression?: Yes Does the patient have breastfeeding experience prior to this delivery?: No (Mother pumped for 2 weeks with her first child and then transitioned to formula)  Feeding Mother's Current Feeding Choice: Breast Milk Nipple Type: Slow - flow  LATCH Score                    Lactation Tools Discussed/Used Tools: Pump;Flanges Flange Size: 21 Breast pump type: Double-Electric Breast Pump;Manual Pump Education: Setup, frequency, and cleaning;Milk Storage Reason for Pumping: Mother desires to pump and bottle feed her expressed milk Pumping frequency: Every three hours Pumped volume: 1 mL  Interventions Interventions: Education;LC  Services brochure;Hand pump;DEBP  Discharge Pump: Personal (Medela)  Consult Status Consult Status: Follow-up Date: 01/30/22 Follow-up type: In-patient    Little Ishikawa 01/29/2022, 8:55 AM

## 2022-01-29 NOTE — Anesthesia Postprocedure Evaluation (Signed)
Anesthesia Post Note  Patient: Fara Chute  Procedure(s) Performed: AN AD HOC LABOR EPIDURAL     Patient location during evaluation: Mother Baby Anesthesia Type: Epidural Level of consciousness: awake and alert Pain management: pain level controlled Vital Signs Assessment: post-procedure vital signs reviewed and stable Respiratory status: spontaneous breathing, nonlabored ventilation and respiratory function stable Cardiovascular status: stable Postop Assessment: no headache, no backache, epidural receding and able to ambulate Anesthetic complications: no   No notable events documented.  Last Vitals:  Vitals:   01/29/22 0355 01/29/22 0755  BP: 129/82 126/73  Pulse:  84  Resp: 18 18  Temp: 36.7 C 36.9 C  SpO2: 98% 100%    Last Pain:  Vitals:   01/29/22 0755  TempSrc: Oral  PainSc: 0-No pain   Pain Goal:                   Airam Heidecker

## 2022-01-29 NOTE — Plan of Care (Signed)
  Problem: Education: Goal: Knowledge of General Education information will improve Description: Including pain rating scale, medication(s)/side effects and non-pharmacologic comfort measures Outcome: Progressing   Problem: Health Behavior/Discharge Planning: Goal: Ability to manage health-related needs will improve Outcome: Progressing   Problem: Clinical Measurements: Goal: Ability to maintain clinical measurements within normal limits will improve Outcome: Progressing Goal: Will remain free from infection Outcome: Progressing Goal: Diagnostic test results will improve Outcome: Progressing Goal: Respiratory complications will improve Outcome: Progressing Goal: Cardiovascular complication will be avoided Outcome: Progressing   Problem: Activity: Goal: Risk for activity intolerance will decrease Outcome: Progressing   Problem: Nutrition: Goal: Adequate nutrition will be maintained Outcome: Progressing   Problem: Coping: Goal: Level of anxiety will decrease Outcome: Progressing   Problem: Elimination: Goal: Will not experience complications related to bowel motility Outcome: Progressing Goal: Will not experience complications related to urinary retention Outcome: Progressing   Problem: Pain Managment: Goal: General experience of comfort will improve Outcome: Progressing   Problem: Safety: Goal: Ability to remain free from injury will improve Outcome: Progressing   Problem: Skin Integrity: Goal: Risk for impaired skin integrity will decrease Outcome: Progressing   Problem: Education: Goal: Knowledge of Childbirth will improve Outcome: Progressing Goal: Ability to make informed decisions regarding treatment and plan of care will improve Outcome: Progressing Goal: Ability to state and carry out methods to decrease the pain will improve Outcome: Progressing Goal: Individualized Educational Video(s) Outcome: Progressing   Problem: Coping: Goal: Ability to  verbalize concerns and feelings about labor and delivery will improve Outcome: Progressing   Problem: Life Cycle: Goal: Ability to make normal progression through stages of labor will improve Outcome: Progressing Goal: Ability to effectively push during vaginal delivery will improve Outcome: Progressing   Problem: Role Relationship: Goal: Will demonstrate positive interactions with the child Outcome: Progressing   Problem: Safety: Goal: Risk of complications during labor and delivery will decrease Outcome: Progressing   Problem: Pain Management: Goal: Relief or control of pain from uterine contractions will improve Outcome: Progressing   Problem: Education: Goal: Knowledge of condition will improve Outcome: Progressing   Problem: Activity: Goal: Will verbalize the importance of balancing activity with adequate rest periods Outcome: Progressing Goal: Ability to tolerate increased activity will improve Outcome: Progressing   Problem: Coping: Goal: Ability to identify and utilize available resources and services will improve Outcome: Progressing   Problem: Life Cycle: Goal: Chance of risk for complications during the postpartum period will decrease Outcome: Progressing   Problem: Role Relationship: Goal: Ability to demonstrate positive interaction with newborn will improve Outcome: Progressing   Problem: Skin Integrity: Goal: Demonstration of wound healing without infection will improve Outcome: Progressing   Problem: Education: Goal: Knowledge of condition will improve Outcome: Progressing Goal: Individualized Educational Video(s) Outcome: Progressing Goal: Individualized Newborn Educational Video(s) Outcome: Progressing   Problem: Activity: Goal: Will verbalize the importance of balancing activity with adequate rest periods Outcome: Progressing Goal: Ability to tolerate increased activity will improve Outcome: Progressing   Problem: Coping: Goal: Ability  to identify and utilize available resources and services will improve Outcome: Progressing   Problem: Life Cycle: Goal: Chance of risk for complications during the postpartum period will decrease Outcome: Progressing   Problem: Role Relationship: Goal: Ability to demonstrate positive interaction with newborn will improve Outcome: Progressing   Problem: Skin Integrity: Goal: Demonstration of wound healing without infection will improve Outcome: Progressing

## 2022-01-30 LAB — RH IG WORKUP (INCLUDES ABO/RH)
Fetal Screen: NEGATIVE
Gestational Age(Wks): 40.3
Unit division: 0

## 2022-02-02 ENCOUNTER — Inpatient Hospital Stay (HOSPITAL_COMMUNITY): Payer: Medicaid Other

## 2022-02-02 NOTE — Progress Notes (Signed)
   GYNECOLOGY OFFICE VISIT NOTE  History:   Ann Hammond is a 32 y.o. [id number] here today for laceration check following her delivery on 1/19. She had an unomplicated SVD but had a 3A laceration. She is not having any issues.    Review of Systems:  Pertinent items noted in HPI and remainder of comprehensive ROS otherwise negative.  Physical Exam:  BP 126/88   Pulse 99   Ht 5\' 6"  (1.676 m)   Wt 105 lb 1.6 oz (47.7 kg)   LMP 04/20/2021 (Exact Date) Comment: some post-delivery bleeding--changing pads 2x a day  Breastfeeding No Comment: pumping and formula  BMI 16.96 kg/m  CONSTITUTIONAL: Well-developed, well-nourished female in no acute distress.  HEENT:  Normocephalic, atraumatic. External right and left ear normal. No scleral icterus.  NECK: Normal range of motion, supple, no masses noted on observation SKIN: No rash noted. Not diaphoretic. No erythema. No pallor. MUSCULOSKELETAL: Normal range of motion. No edema noted. NEUROLOGIC: Alert and oriented to person, place, and time. Normal muscle tone coordination. No cranial nerve deficit noted. PSYCHIATRIC: Normal mood and affect. Normal behavior. Normal judgment and thought content.  PELVIC:  Perineum inspected and healing well   Labs and Imaging Results for orders placed or performed during the hospital encounter of 01/28/22 (from the past 168 hour(s))  CBC   Collection Time: 01/29/22  5:08 AM  Result Value Ref Range   WBC 11.7 (H) 4.0 - 10.5 K/uL   RBC 3.94 3.87 - 5.11 MIL/uL   Hemoglobin 11.7 (L) 12.0 - 15.0 g/dL   HCT 35.5 (L) 36.0 - 46.0 %   MCV 90.1 80.0 - 100.0 fL   MCH 29.7 26.0 - 34.0 pg   MCHC 33.0 30.0 - 36.0 g/dL   RDW 13.5 11.5 - 15.5 %   Platelets 162 150 - 400 K/uL   nRBC 0.0 0.0 - 0.2 %  Rh IG workup (includes ABO/Rh)   Collection Time: 01/29/22  5:08 AM  Result Value Ref Range   Gestational Age(Wks) 40.3    Fetal Screen NEG    Unit Number [id number]    Blood Component Type RHIG    Unit division 00     Status of Unit ISSUED,FINAL    Transfusion Status      OK TO TRANSFUSE Performed at [facility], [address], [place], [place] [address]    No results found.  Assessment and Plan:   1. Postpartum care and examination - Laceration healing well - Discussed PFPT after PP check but referral sent today.  -     Ambulatory referral to Physical Therapy    Routine preventative health maintenance measures emphasized. Please refer to After Visit Summary for other counseling recommendations.   Return for Follow up as scheduled for postpartum visit .  Radene Gunning, MD, [facility], [place]

## 2022-02-04 ENCOUNTER — Encounter: Payer: Self-pay | Admitting: Obstetrics and Gynecology

## 2022-02-04 ENCOUNTER — Ambulatory Visit (INDEPENDENT_AMBULATORY_CARE_PROVIDER_SITE_OTHER): Payer: Medicaid Other | Admitting: Obstetrics and Gynecology

## 2022-02-04 ENCOUNTER — Ambulatory Visit: Payer: Medicaid Other

## 2022-02-04 ENCOUNTER — Other Ambulatory Visit: Payer: Self-pay

## 2022-02-19 ENCOUNTER — Other Ambulatory Visit: Payer: Self-pay | Admitting: Advanced Practice Midwife

## 2022-02-19 DIAGNOSIS — O26893 Other specified pregnancy related conditions, third trimester: Secondary | ICD-10-CM

## 2022-03-02 ENCOUNTER — Ambulatory Visit: Payer: Medicaid Other | Admitting: Obstetrics and Gynecology

## 2022-03-03 ENCOUNTER — Other Ambulatory Visit: Payer: Self-pay

## 2022-03-03 ENCOUNTER — Ambulatory Visit (INDEPENDENT_AMBULATORY_CARE_PROVIDER_SITE_OTHER): Payer: Medicaid Other | Admitting: Obstetrics and Gynecology

## 2022-03-03 ENCOUNTER — Encounter: Payer: Self-pay | Admitting: Obstetrics and Gynecology

## 2022-03-03 NOTE — Progress Notes (Signed)
    [place] Partum Visit Note  Ann Hammond is a 32 y.o. [id number] s/p 1/19 SVD/3a laceration at 2w3dafter an elective IOL.   Anesthesia: epidural. Postpartum course has been uncomplicated. Baby is doing well. Baby is feeding by both breast and bottle - Similac . Bleeding staining only. Bowel function is normal. Bladder function is normal. Patient is not sexually active. Contraception method is condoms. Postpartum depression screening: negative.  Bleeding that feels like a period started a few days ago and is subsiding. Prior PP lochia was decreased prior to this   Upstream - 03/03/22 1630       Pregnancy Intention Screening   Does the patient want to become pregnant in the next year? No    Does the patient's partner want to become pregnant in the next year? No    Would the patient like to discuss contraceptive options today? No      Contraception Wrap Up   Current Method Female Condom              Edinburgh Postnatal Depression Scale - 03/03/22 1629       Edinburgh Postnatal Depression Scale:  In the Past 7 Days   I have been able to laugh and see the funny side of things. 0    I have looked forward with enjoyment to things. 0    I have blamed myself unnecessarily when things went wrong. 0    I have been anxious or worried for no good reason. 0    I have felt scared or panicky for no good reason. 0    Things have been getting on top of me. 0    I have been so unhappy that I have had difficulty sleeping. 0    I have felt sad or miserable. 0    I have been so unhappy that I have been crying. 0    The thought of harming myself has occurred to me. 0    Edinburgh Postnatal Depression Scale Total 0            Review of Systems Pertinent items noted in HPI and remainder of comprehensive ROS otherwise negative.  Objective:  BP 114/83   Pulse 86   Wt 222 lb 1.6 oz (100.7 kg)   LMP 04/20/2021 (Exact Date) Comment: some post-delivery bleeding--changing pads 2x a day   Breastfeeding Yes   BMI 35.85 kg/m    General: NAD Pelvic: declined  Assessment:   Normal postpartum exam.   Plan:  Likely period resuming and pt told to let uKoreaknow if bleeding doesn't continue to subside; may consider u/s  Patient elects to do condoms  She declines pap today; d/w her need for routine pap smear.   RTC: 357mor annual and pap smear  ChAletha HalimMD [facility]

## 2022-03-04 ENCOUNTER — Encounter: Payer: Self-pay | Admitting: Obstetrics and Gynecology

## 2022-06-22 ENCOUNTER — Telehealth: Payer: Self-pay | Admitting: Family Medicine

## 2022-06-22 NOTE — Telephone Encounter (Signed)
Want to get her Thyroid and Vitamin B and D checked

## 2022-06-28 ENCOUNTER — Encounter: Payer: Self-pay | Admitting: Advanced Practice Midwife

## 2022-07-19 ENCOUNTER — Encounter: Payer: Self-pay | Admitting: Internal Medicine

## 2022-07-19 ENCOUNTER — Ambulatory Visit (INDEPENDENT_AMBULATORY_CARE_PROVIDER_SITE_OTHER): Payer: Medicaid Other | Admitting: Internal Medicine

## 2022-07-19 VITALS — BP 120/82 | HR 91 | Temp 98.4°F | Ht 66.0 in | Wt 226.8 lb

## 2022-07-19 DIAGNOSIS — E038 Other specified hypothyroidism: Secondary | ICD-10-CM

## 2022-07-19 DIAGNOSIS — E669 Obesity, unspecified: Secondary | ICD-10-CM

## 2022-07-19 DIAGNOSIS — E66812 Obesity, class 2: Secondary | ICD-10-CM

## 2022-07-19 DIAGNOSIS — E559 Vitamin D deficiency, unspecified: Secondary | ICD-10-CM

## 2022-07-19 DIAGNOSIS — Z6836 Body mass index (BMI) 36.0-36.9, adult: Secondary | ICD-10-CM

## 2022-07-19 DIAGNOSIS — E282 Polycystic ovarian syndrome: Secondary | ICD-10-CM

## 2022-07-19 NOTE — Progress Notes (Signed)
[facility] [address] [place] [address] Dept: [phone] Dept Fax: [phone]  New Patient Office Visit  Subjective:   Ann Hammond [birth year] 07/19/2022  Chief Complaint  Patient presents with   Establish Care   Thyroid Problem    HPI: MERRELL LUTTRELL presents today to establish care at [facility] at [facility]. Introduced to Publishing rights manager role and practice setting.  All questions answered.   Concerns: See below   Discussed the use of AI scribe software for clinical note transcription with the patient, who gave verbal consent to proceed.  History of Present Illness   The patient, a 78-month postpartum individual with a history of Polycystic Ovary Syndrome (PCOS) and hypothyroidism, presents to establish care. She reports experiencing significant hair loss, which she did not experience after her previous pregnancy. She also reports difficulty losing weight and intense cravings, which she attributes to her PCOS. She expresses concern that her PCOS may be "out of whack" or that her thyroid may be imbalanced. She has been on levothyroxine for hypothyroidism, which was last checked six months ago during her pregnancy. She also reports increased hirsutism, a common symptom of PCOS. She has previously taken metformin and phentermine, which she tolerated well and helped with weight loss. She is not currently breastfeeding.  She also reports vitamin d deficiency in the past. Needs to be rechecked.       The following portions of the patient's history were reviewed and updated as appropriate: past medical history, past surgical history, family history, social history, allergies, medications, and problem list.   Patient Active Problem List   Diagnosis Date Noted   Rubella non-immune status, antepartum 07/14/2021   Hypothyroidism 06/23/2021   PCOS (polycystic ovarian syndrome) 06/23/2021   Subclinical  hypothyroidism 07/22/2020   Past Medical History:  Diagnosis Date   Hypothyroidism    No pertinent past medical history    Polycystic ovary syndrome 07/20/2020   testosterone, DHEAS, A1c, PRL WNL 07/2020 TVUS w/PCOS 08/2020 Continue metformin 01/2021: No ovulation with letrozole x 3 (last 5mg ). Takes full 10 days provera, spots 3 days and period started heavy x 5 days after 3 days of spotting. Will do CD#1 as first day of bleeding. New plan: Letrozole 5mg  CD#3 labs every cycle CD#21 P e very cycle Repeat TSH with DAY 3 labs this cycle. (will count CD3 as firs   Rh negative state in antepartum period 07/12/2021   Third degree laceration of perineum during delivery, postpartum 01/29/2022   Past Surgical History:  Procedure Laterality Date   APPENDECTOMY     Family History  Problem Relation Age of Onset   Hypertension Father    CAD Father    Hyperlipidemia Maternal Grandmother    Diabetes Maternal Grandmother    Lung cancer Maternal Grandfather    Pancreatic cancer Paternal Grandmother    Diabetes Other    Anesthesia problems Neg Hx    Malignant hyperthermia Neg Hx    Pseudochol deficiency Neg Hx    Hypotension Neg Hx    Outpatient Medications Prior to Visit  Medication Sig Dispense Refill   Cholecalciferol (VITAMIN D3) 50 MCG (2000 UT) TABS Take 2,000 Units by mouth daily.     levothyroxine (SYNTHROID) 25 MCG tablet Take 1 tablet (25 mcg total) by mouth daily before breakfast. 90 tablet 2   acetaminophen (TYLENOL) 500 MG tablet Take 1,000 mg by mouth every 6 (six) hours as needed (for pain). (Patient not taking: Reported  on 02/04/2022)     famotidine (PEPCID) 20 MG tablet Take 1 tablet (20 mg total) by mouth 2 (two) times daily. (Patient not taking: Reported on 02/04/2022) 30 tablet 3   ibuprofen (ADVIL) 600 MG tablet Take 1 tablet (600 mg total) by mouth every 6 (six) hours. 30 tablet 0   Prenatal Vit-Fe Fumarate-FA (PRENATAL MULTIVITAMIN) TABS Take 1 tablet by mouth daily.     TUMS  500 MG chewable tablet Chew 1-2 tablets by mouth 3 (three) times daily as needed for indigestion or heartburn. (Patient not taking: Reported on 02/04/2022)     No facility-administered medications prior to visit.   No Known Allergies  ROS: A complete ROS was performed with pertinent positives/negatives noted in the HPI. The remainder of the ROS are negative.   Objective:   Today's Vitals   07/19/22 1319  BP: 120/82  Pulse: 91  Temp: 98.4 F (36.9 C)  TempSrc: Temporal  SpO2: 99%  Weight: 226 lb 12.8 oz (102.9 kg)  Height: 5\' 6"  (1.676 m)   GENERAL: Well-appearing, in NAD. Well nourished.  SKIN: Pink, warm and dry. No rash, lesion, ulceration, or ecchymoses. Hirsutism to chin NECK: Trachea midline. Full ROM w/o pain or tenderness. No lymphadenopathy.  RESPIRATORY: Chest wall symmetrical. Respirations even and non-labored. Breath sounds clear to auscultation bilaterally.  CARDIAC: S1, S2 present, regular rate and rhythm. Peripheral pulses 2+ bilaterally.  EXTREMITIES: Without clubbing, cyanosis, or edema.  NEUROLOGIC: Steady, even gait.  PSYCH/MENTAL STATUS: Alert, oriented x 3. Cooperative, appropriate mood and affect.   There are no preventive care reminders to display for this patient.  No results found for any visits on 07/19/22.     Assessment & Plan:  Assessment and Plan    Polycystic Ovary Syndrome (PCOS): Reports difficulty losing weight, increased hair growth, and cravings. History of using metformin and phentermine for weight loss. -Order hormonal panel including testosterone level. -Consider starting metformin and spironolactone based on lab results.  Hypothyroidism: Currently on levothyroxine. Reports hair loss and difficulty losing weight. Last thyroid function test was six months ago during pregnancy. -Check thyroid function tests today. -Adjust levothyroxine dose based on lab results.  Vitamin D Deficiency: Patient has hx of.  - obtaining Vitamin D level    Obesity: - obtain labs  - discussed options of weight management with medication. - awaiting lab results prior to medication   General Health Maintenance: -Plan for a full physical in three months. -Check cholesterol level during fasting blood work. -Return for fasting blood work in the morning within the next week.            Return in about 3 months (around 10/19/2022) for Annual Physical Exam.   Of note, portions of this note may have been created with voice recognition software Physicist, medical). While this note has been edited for accuracy, occasional wrong-word or 'sound-a-like' substitutions may have occurred due to the inherent limitations of voice recognition software.  Salvatore Decent, FNP

## 2022-07-20 ENCOUNTER — Other Ambulatory Visit: Payer: Self-pay | Admitting: Internal Medicine

## 2022-07-20 ENCOUNTER — Other Ambulatory Visit (INDEPENDENT_AMBULATORY_CARE_PROVIDER_SITE_OTHER): Payer: Medicaid Other

## 2022-07-20 DIAGNOSIS — E559 Vitamin D deficiency, unspecified: Secondary | ICD-10-CM | POA: Diagnosis not present

## 2022-07-20 DIAGNOSIS — E282 Polycystic ovarian syndrome: Secondary | ICD-10-CM

## 2022-07-20 DIAGNOSIS — Z6836 Body mass index (BMI) 36.0-36.9, adult: Secondary | ICD-10-CM | POA: Diagnosis not present

## 2022-07-20 DIAGNOSIS — E038 Other specified hypothyroidism: Secondary | ICD-10-CM | POA: Diagnosis not present

## 2022-07-20 DIAGNOSIS — E669 Obesity, unspecified: Secondary | ICD-10-CM

## 2022-07-20 DIAGNOSIS — E66812 Obesity, class 2: Secondary | ICD-10-CM

## 2022-07-20 LAB — COMPREHENSIVE METABOLIC PANEL
ALT: 33 U/L (ref 0–35)
AST: 20 U/L (ref 0–37)
Albumin: 4.6 g/dL (ref 3.5–5.2)
Alkaline Phosphatase: 74 U/L (ref 39–117)
BUN: 15 mg/dL (ref 6–23)
CO2: 27 mEq/L (ref 19–32)
Calcium: 9.9 mg/dL (ref 8.4–10.5)
Chloride: 102 mEq/L (ref 96–112)
Creatinine, Ser: 0.75 mg/dL (ref 0.40–1.20)
GFR: 104.63 mL/min (ref >60.00)
Glucose, Bld: 106 mg/dL — ABNORMAL HIGH (ref 70–99)
Potassium: 4.8 mEq/L (ref 3.5–5.1)
Sodium: 138 mEq/L (ref 135–145)
Total Bilirubin: 0.6 mg/dL (ref 0.2–1.2)
Total Protein: 7.4 g/dL (ref 6.0–8.3)

## 2022-07-20 LAB — LIPID PANEL
Cholesterol: 181 mg/dL (ref 0–200)
HDL: 48.1 mg/dL (ref >39.00)
LDL Cholesterol: 103 mg/dL — ABNORMAL HIGH (ref 0–99)
NonHDL: 133.03
Total CHOL/HDL Ratio: 4
Triglycerides: 150 mg/dL — ABNORMAL HIGH (ref 0.0–149.0)
VLDL: 30 mg/dL (ref 0.0–40.0)

## 2022-07-20 LAB — CBC WITH DIFFERENTIAL/PLATELET
Basophils Absolute: 0.1 10*3/uL (ref 0.0–0.1)
Basophils Relative: 0.9 % (ref 0.0–3.0)
Eosinophils Absolute: 0.2 10*3/uL (ref 0.0–0.7)
Eosinophils Relative: 3.1 % (ref 0.0–5.0)
HCT: 42 % (ref 36.0–46.0)
Hemoglobin: 13.9 g/dL (ref 12.0–15.0)
Lymphocytes Relative: 35.7 % (ref 12.0–46.0)
Lymphs Abs: 2.2 10*3/uL (ref 0.7–4.0)
MCHC: 33 g/dL (ref 30.0–36.0)
MCV: 86.8 fl (ref 78.0–100.0)
Monocytes Absolute: 0.3 10*3/uL (ref 0.1–1.0)
Monocytes Relative: 5.3 % (ref 3.0–12.0)
Neutro Abs: 3.3 10*3/uL (ref 1.4–7.7)
Neutrophils Relative %: 55 % (ref 43.0–77.0)
Platelets: 271 10*3/uL (ref 150.0–400.0)
RBC: 4.83 Mil/uL (ref 3.87–5.11)
RDW: 12.2 % (ref 11.5–15.5)
WBC: 6 10*3/uL (ref 4.0–10.5)

## 2022-07-20 LAB — HEMOGLOBIN A1C: Hgb A1c MFr Bld: 5.5 % (ref 4.6–6.5)

## 2022-07-20 LAB — VITAMIN D 25 HYDROXY (VIT D DEFICIENCY, FRACTURES): VITD: 29.9 ng/mL — ABNORMAL LOW (ref 30.00–100.00)

## 2022-07-20 LAB — T4, FREE: Free T4: 0.72 ng/dL (ref 0.60–1.60)

## 2022-07-20 LAB — TESTOSTERONE: Testosterone: 77.14 ng/dL — ABNORMAL HIGH (ref 15.00–40.00)

## 2022-07-20 LAB — TSH: TSH: 0.88 u[IU]/mL (ref 0.35–5.50)

## 2022-07-21 LAB — INSULIN, RANDOM: Insulin: 30.3 u[IU]/mL — ABNORMAL HIGH

## 2022-07-21 LAB — DHEA-SULFATE: DHEA-SO4: 218 ug/dL (ref 19–237)

## 2022-07-22 LAB — DHEA-SULFATE: DHEA-SO4: 284 ug/dL (ref 84.8–378.0)

## 2022-07-22 LAB — INSULIN, RANDOM: INSULIN: 37.8 u[IU]/mL — ABNORMAL HIGH (ref 2.6–24.9)

## 2022-07-22 LAB — SPECIMEN STATUS REPORT

## 2022-07-25 ENCOUNTER — Encounter: Payer: Self-pay | Admitting: Internal Medicine

## 2022-07-26 ENCOUNTER — Other Ambulatory Visit: Payer: Self-pay | Admitting: Internal Medicine

## 2022-07-26 ENCOUNTER — Encounter: Payer: Self-pay | Admitting: Internal Medicine

## 2022-07-26 DIAGNOSIS — L680 Hirsutism: Secondary | ICD-10-CM | POA: Insufficient documentation

## 2022-07-26 DIAGNOSIS — E88819 Insulin resistance, unspecified: Secondary | ICD-10-CM | POA: Insufficient documentation

## 2022-07-26 DIAGNOSIS — E282 Polycystic ovarian syndrome: Secondary | ICD-10-CM

## 2022-07-26 MED ORDER — SPIRONOLACTONE 50 MG PO TABS
50.0000 mg | ORAL_TABLET | Freq: Every day | ORAL | 1 refills | Status: DC
Start: 2022-07-26 — End: 2023-01-23

## 2022-07-26 MED ORDER — ALBUTEROL SULFATE HFA 108 (90 BASE) MCG/ACT IN AERS: INHALATION_SPRAY | RESPIRATORY_TRACT | 1 refills | Status: DC

## 2022-07-26 MED ORDER — METFORMIN HCL ER 500 MG PO TB24: 500.0000 mg | ORAL_TABLET | Freq: Every day | ORAL | 1 refills | Status: DC

## 2022-07-27 ENCOUNTER — Other Ambulatory Visit: Payer: Self-pay | Admitting: Internal Medicine

## 2022-07-27 ENCOUNTER — Telehealth: Payer: Self-pay | Admitting: Internal Medicine

## 2022-07-27 DIAGNOSIS — E669 Obesity, unspecified: Secondary | ICD-10-CM

## 2022-07-27 MED ORDER — PHENTERMINE HCL 15 MG PO CAPS: 15.0000 mg | ORAL_CAPSULE | ORAL | 0 refills | Status: DC

## 2022-07-27 NOTE — Telephone Encounter (Signed)
This pertains to a message pt sent over on 07/25/22. She is under the impression she is going to be prescribed  phentermine. Please advise pt at [phone].

## 2022-07-27 NOTE — Telephone Encounter (Signed)
Patient was notified of Rx.  ?

## 2022-07-27 NOTE — Telephone Encounter (Signed)
Yes, prescription sent in for patient.

## 2022-07-27 NOTE — Telephone Encounter (Signed)
Please advise 

## 2022-08-15 ENCOUNTER — Ambulatory Visit: Payer: Medicaid Other | Admitting: Obstetrics & Gynecology

## 2022-08-23 ENCOUNTER — Other Ambulatory Visit: Payer: Self-pay | Admitting: Internal Medicine

## 2022-08-23 DIAGNOSIS — E669 Obesity, unspecified: Secondary | ICD-10-CM

## 2022-08-25 MED ORDER — PHENTERMINE HCL 15 MG PO CAPS
15.0000 mg | ORAL_CAPSULE | ORAL | 0 refills | Status: AC
Start: 2022-08-25 — End: ?

## 2022-08-25 NOTE — Telephone Encounter (Signed)
Requesting: phentermine 15 MG capsule  Last Visit: 07/19/2022 Next Visit: 10/11/2022 Last Refill: 07/27/2022  Please Advise

## 2022-10-10 NOTE — Progress Notes (Unsigned)
Subjective:   Ann Hammond 1990-04-02  10/11/2022   CC: No chief complaint on file.   HPI: Ann Hammond is a 32 y.o. female who presents for a routine health maintenance exam.  Labs collected at time of visit.    HEALTH SCREENINGS: - Pap smear: {Blank single:19197::"pap done","not applicable","up to date","done elsewhere"} - Mammogram (40+): Not applicable  - Colonoscopy (45+): Not applicable  - Bone Density (65+): Not applicable  - Lung CA screening with low-dose CT:  Not applicable Adults age 16-80 who are current cigarette smokers or quit within the last 15 years. Must have 20 pack year history.   Depression and Anxiety Screen done today and results listed below:     07/19/2022    1:26 PM 10/08/2021   10:16 AM 09/09/2021   10:31 AM 08/12/2021    2:43 PM 07/12/2021    5:46 PM  Depression screen PHQ 2/9  Decreased Interest 0 0 0 0 1  Down, Depressed, Hopeless 0 0 0 0 0  PHQ - 2 Score 0 0 0 0 1  Altered sleeping 0 0 2 0 1  Tired, decreased energy 2 1 1 2 1   Change in appetite 2 0 0 0 1  Feeling bad or failure about yourself  0 0 0 0 0  Trouble concentrating 0 0 0 0 0  Moving slowly or fidgety/restless 0 0 0 0 0  Suicidal thoughts 0 0 0 0 0  PHQ-9 Score 4 1 3 2 4   Difficult doing work/chores Somewhat difficult          07/19/2022    1:26 PM 10/08/2021   10:17 AM 09/09/2021   10:31 AM 08/12/2021    2:43 PM  GAD 7 : Generalized Anxiety Score  Nervous, Anxious, on Edge 1 0 0 1  Control/stop worrying 0 0 0 0  Worry too much - different things 0 0 0 0  Trouble relaxing 1 0 0 0  Restless 0 0 0 0  Easily annoyed or irritable 1 0 0 0  Afraid - awful might happen 0 0 0 0  Total GAD 7 Score 3 0 0 1  Anxiety Difficulty Somewhat difficult       IMMUNIZATIONS: - Tdap: Tetanus vaccination status reviewed: last tetanus booster within 10 years. - Influenza: {Blank single:19197::"Up to date","Administered today","Postponed to flu season","Refused","Given elsewhere"} -  Pneumovax: Not applicable - Prevnar 20: Not applicable - Zostavax (50+): Not applicable   Past medical history, surgical history, medications, allergies, family history and social history reviewed with patient today and changes made to appropriate areas of the chart.   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 Takes full 10 days provera, spots 3 days and period started heavy x 5 days after 3 days of spotting. Will do CD#1 as first day of bleeding. New plan: Letrozole 5mg  CD#3 labs every cycle CD#21 P e very cycle Repeat TSH with DAY 3 labs this cycle. (will count CD3 as firs   Rh negative state in antepartum period 07/12/2021   Third degree laceration of perineum during delivery, postpartum 01/29/2022    Past Surgical History:  Procedure Laterality Date   APPENDECTOMY      Current Outpatient Medications on File Prior to Visit  Medication Sig  albuterol (VENTOLIN HFA) 108 (90 Base) MCG/ACT inhaler Inhale 1-2 puffs every 4-6 hours as needed for shortness of breath or wheezing.   Cholecalciferol (VITAMIN D3) 50 MCG (2000 UT) TABS Take 2,000 Units by mouth daily.   levothyroxine (SYNTHROID) 25 MCG tablet Take 1 tablet (25 mcg total) by mouth daily before breakfast.   metFORMIN (GLUCOPHAGE-XR) 500 MG 24 hr tablet Take 1 tablet (500 mg total) by mouth daily with breakfast.   phentermine 15 MG capsule Take 1 capsule (15 mg total) by mouth every morning.   spironolactone (ALDACTONE) 50 MG tablet Take 1 tablet (50 mg total) by mouth daily.   [DISCONTINUED] budesonide (RHINOCORT AQUA) 32 MCG/ACT nasal spray 1 spray each nostril qd (Patient not taking: Reported on 11/17/2017)   [DISCONTINUED] ipratropium (ATROVENT) 0.06 % nasal spray Place 2 sprays into both nostrils 4 (four)  times daily. (Patient not taking: Reported on 11/17/2017)   [DISCONTINUED] levofloxacin (LEVAQUIN) 500 MG tablet Take 1 tablet (500 mg total) by mouth daily.   No current facility-administered medications on file prior to visit.    No Known Allergies   Social History   Socioeconomic History   Marital status: Married    Spouse name: Not on file   Number of children: Not on file   Years of education: Not on file   Highest education level: Not on file  Occupational History   Occupation: homemaker  Tobacco Use   Smoking status: Former    Current packs/day: 0.00    Types: Cigarettes    Quit date: 03/19/2010    Years since quitting: 12.5   Smokeless tobacco: Never  Vaping Use   Vaping status: Never Used  Substance and Sexual Activity   Alcohol use: Not Currently    Comment: occ   Drug use: No   Sexual activity: Not Currently    Birth control/protection: None  Other Topics Concern   Not on file  Social History Narrative   Not on file   Social Determinants of Health   Financial Resource Strain: Not on file  Food Insecurity: No Food Insecurity (01/28/2022)   Hunger Vital Sign    Worried About Running Out of Food in the Last Year: Never true    Ran Out of Food in the Last Year: Never true  Transportation Needs: No Transportation Needs (01/28/2022)   PRAPARE - Administrator, Civil Service (Medical): No    Lack of Transportation (Non-Medical): No  Physical Activity: Not on file  Stress: Not on file  Social Connections: Not on file  Intimate Partner Violence: Not At Risk (01/28/2022)   Humiliation, Afraid, Rape, and Kick questionnaire    Fear of Current or Ex-Partner: No    Emotionally Abused: No    Physically Abused: No    Sexually Abused: No   Social History   Tobacco Use  Smoking Status Former   Current packs/day: 0.00   Types: Cigarettes   Quit date: 03/19/2010   Years since quitting: 12.5  Smokeless Tobacco Never   Social History   Substance and  Sexual Activity  Alcohol Use Not Currently   Comment: occ    Family History  Problem Relation Age of Onset   Hypertension Father    CAD Father    Hyperlipidemia Maternal Grandmother    Diabetes Maternal Grandmother    Lung cancer Maternal Grandfather    Pancreatic cancer Paternal Grandmother    Diabetes Other    Anesthesia problems Neg Hx    Malignant hyperthermia Neg  Hx    Pseudochol deficiency Neg Hx    Hypotension Neg Hx      ROS: Denies fever, fatigue, unexplained weight loss/gain, hearing or vision changes, cardiac or respiratory complaints. Denies neurological deficits, musculoskeletal complaints, gastrointestinal or genitourinary complaints, mental health complaints, and skin changes.   Objective:   There were no vitals filed for this visit.  GENERAL APPEARANCE: Well-appearing, in NAD. Well nourished.  SKIN: Pink, warm and dry. Turgor normal. No rash, lesion, ulceration, or ecchymoses. Hair evenly distributed.  HEENT: HEAD: Normocephalic.  EYES: PERRLA. EOMI. Lids intact w/o defect. Sclera white, Conjunctiva pink w/o exudate.  EARS: External ear w/o redness, swelling, masses or lesions. EAC clear. TM's intact, translucent w/o bulging, appropriate landmarks visualized. Appropriate acuity to conversational tones.  NOSE: Septum midline w/o deformity. Nares patent, mucosa pink and non-inflamed w/o drainage. No sinus tenderness.  THROAT: Uvula midline. Oropharynx clear. Tonsils non-inflamed w/o exudate ***. Oral mucosa pink and moist.  NECK: Supple, Trachea midline. Full ROM w/o pain or tenderness. No lymphadenopathy. Thyroid non-tender w/o enlargement or palpable masses.  BREASTS: Breasts pendulous, symmetrical, and w/o palpable masses. Nipples everted and w/o discharge. No rash or skin retraction. No axillary or supraclavicular lymphadenopathy.  RESPIRATORY: Chest wall symmetrical w/o masses. Respirations even and non-labored. Breath sounds clear to auscultation bilaterally.  No wheezes, rales, rhonchi, or crackles. CARDIAC: S1, S2 present, regular rate and rhythm. No gallops, murmurs, rubs, or clicks. No carotid bruits. Capillary refill <2 seconds. Peripheral pulses 2+ bilaterally. GI: Abdomen soft w/o distention. Normoactive bowel sounds. No palpable masses or tenderness. No guarding or rebound tenderness. Liver and spleen w/o tenderness or enlargement. No CVA tenderness.  GU: *** External genitalia without erythema, lesions, or masses. No lymphadenopathy. Vaginal mucosa pink and moist without exudate, lesions, or ulcerations. Cervix pink without discharge. Cervical os closed. Uterus and adnexae palpable, not enlarged, and w/o tenderness. No palpable masses.  MSK: Muscle tone and strength appropriate for age, w/o atrophy or abnormal movement.  EXTREMITIES: Active ROM intact, w/o tenderness, crepitus, or contracture. No obvious joint deformities or effusions. No clubbing, edema, or cyanosis.  NEUROLOGIC: CN's II-XII intact. Motor strength symmetrical with no obvious weakness. No sensory deficits. DTR's 2+ symmetric bilaterally. Steady, even gait.  PSYCH/MENTAL STATUS: Alert, oriented x 3. Cooperative, appropriate mood and affect.   Chaperoned by Mary Sella CMA ***   Assessment & Plan:  There are no diagnoses linked to this encounter.  No orders of the defined types were placed in this encounter.   PATIENT COUNSELING:  - Encouraged a healthy well-balanced diet. Patient may adjust caloric intake to maintain or achieve ideal body weight. May reduce intake of dietary saturated fat and total fat and have adequate dietary potassium and calcium preferably from fresh fruits, vegetables, and low-fat dairy products.   - Advised to avoid cigarette smoking. - Discussed with the patient that most people either abstain from alcohol or drink within safe limits (<=14/week and <=4 drinks/occasion for males, <=7/weeks and <= 3 drinks/occasion for females) and that the risk for  alcohol disorders and other health effects rises proportionally with the number of drinks per week and how often a drinker exceeds daily limits. - Discussed cessation/primary prevention of drug use and availability of treatment for abuse.  - Discussed sexually transmitted diseases, avoidance of unintended pregnancy and contraceptive alternatives.  - Stressed the importance of regular exercise - Injury prevention: Discussed safety belts, safety helmets, smoke detector, smoking near bedding or upholstery.  - Dental health: Discussed importance of  regular tooth brushing, flossing, and dental visits.   NEXT PREVENTATIVE PHYSICAL DUE IN 1 YEAR.  No follow-ups on file.  Salvatore Decent, FNP

## 2022-10-11 ENCOUNTER — Ambulatory Visit (INDEPENDENT_AMBULATORY_CARE_PROVIDER_SITE_OTHER): Payer: Medicaid Other | Admitting: Internal Medicine

## 2022-10-11 ENCOUNTER — Encounter: Payer: Self-pay | Admitting: Internal Medicine

## 2022-10-11 VITALS — BP 110/74 | HR 90 | Temp 98.2°F | Ht 66.0 in | Wt 218.8 lb

## 2022-10-11 DIAGNOSIS — F411 Generalized anxiety disorder: Secondary | ICD-10-CM | POA: Diagnosis not present

## 2022-10-11 DIAGNOSIS — E669 Obesity, unspecified: Secondary | ICD-10-CM | POA: Diagnosis not present

## 2022-10-11 DIAGNOSIS — Z0000 Encounter for general adult medical examination without abnormal findings: Secondary | ICD-10-CM

## 2022-10-11 DIAGNOSIS — Z124 Encounter for screening for malignant neoplasm of cervix: Secondary | ICD-10-CM

## 2022-10-11 DIAGNOSIS — Z23 Encounter for immunization: Secondary | ICD-10-CM

## 2022-10-11 DIAGNOSIS — Z6835 Body mass index (BMI) 35.0-35.9, adult: Secondary | ICD-10-CM | POA: Diagnosis not present

## 2022-10-11 LAB — CBC WITH DIFFERENTIAL/PLATELET
Basophils Absolute: 0.1 10*3/uL (ref 0.0–0.1)
Basophils Relative: 0.9 % (ref 0.0–3.0)
Eosinophils Absolute: 0.4 10*3/uL (ref 0.0–0.7)
Eosinophils Relative: 4.9 % (ref 0.0–5.0)
HCT: 41.2 % (ref 36.0–46.0)
Hemoglobin: 13.5 g/dL (ref 12.0–15.0)
Lymphocytes Relative: 30.1 % (ref 12.0–46.0)
Lymphs Abs: 2.5 10*3/uL (ref 0.7–4.0)
MCHC: 32.7 g/dL (ref 30.0–36.0)
MCV: 85.4 fL (ref 78.0–100.0)
Monocytes Absolute: 0.4 10*3/uL (ref 0.1–1.0)
Monocytes Relative: 5.2 % (ref 3.0–12.0)
Neutro Abs: 5 10*3/uL (ref 1.4–7.7)
Neutrophils Relative %: 58.9 % (ref 43.0–77.0)
Platelets: 303 10*3/uL (ref 150.0–400.0)
RBC: 4.82 Mil/uL (ref 3.87–5.11)
RDW: 12.6 % (ref 11.5–15.5)
WBC: 8.4 10*3/uL (ref 4.0–10.5)

## 2022-10-11 LAB — TSH: TSH: 3.46 u[IU]/mL (ref 0.35–5.50)

## 2022-10-11 LAB — COMPREHENSIVE METABOLIC PANEL
ALT: 21 U/L (ref 0–35)
AST: 14 U/L (ref 0–37)
Albumin: 4.3 g/dL (ref 3.5–5.2)
Alkaline Phosphatase: 77 U/L (ref 39–117)
BUN: 13 mg/dL (ref 6–23)
CO2: 26 meq/L (ref 19–32)
Calcium: 9.3 mg/dL (ref 8.4–10.5)
Chloride: 103 meq/L (ref 96–112)
Creatinine, Ser: 0.78 mg/dL (ref 0.40–1.20)
GFR: 99.66 mL/min (ref >60.00)
Glucose, Bld: 99 mg/dL (ref 70–99)
Potassium: 4.2 meq/L (ref 3.5–5.1)
Sodium: 138 meq/L (ref 135–145)
Total Bilirubin: 0.4 mg/dL (ref 0.2–1.2)
Total Protein: 6.9 g/dL (ref 6.0–8.3)

## 2022-10-11 LAB — LIPID PANEL
Cholesterol: 160 mg/dL (ref 0–200)
HDL: 49.3 mg/dL (ref >39.00)
LDL Cholesterol: 67 mg/dL (ref 0–99)
NonHDL: 111.02
Total CHOL/HDL Ratio: 3
Triglycerides: 219 mg/dL — ABNORMAL HIGH (ref 0.0–149.0)
VLDL: 43.8 mg/dL — ABNORMAL HIGH (ref 0.0–40.0)

## 2022-10-11 MED ORDER — PHENTERMINE HCL 30 MG PO CAPS
30.0000 mg | ORAL_CAPSULE | ORAL | 0 refills | Status: DC
Start: 2022-10-11 — End: 2023-05-03

## 2022-10-11 MED ORDER — BUSPIRONE HCL 10 MG PO TABS
10.0000 mg | ORAL_TABLET | Freq: Two times a day (BID) | ORAL | 2 refills | Status: DC | PRN
Start: 2022-10-11 — End: 2023-01-24

## 2022-10-26 ENCOUNTER — Telehealth: Payer: Self-pay

## 2022-10-26 ENCOUNTER — Encounter: Payer: Self-pay | Admitting: Internal Medicine

## 2022-10-26 DIAGNOSIS — E039 Hypothyroidism, unspecified: Secondary | ICD-10-CM

## 2022-10-26 NOTE — Telephone Encounter (Signed)
Is it ok to refill  Last Ov 10/11/22

## 2022-10-27 ENCOUNTER — Telehealth: Payer: Self-pay | Admitting: Internal Medicine

## 2022-10-27 DIAGNOSIS — E039 Hypothyroidism, unspecified: Secondary | ICD-10-CM

## 2022-10-27 MED ORDER — LEVOTHYROXINE SODIUM 25 MCG PO TABS
25.0000 ug | ORAL_TABLET | Freq: Every day | ORAL | 2 refills | Status: DC
Start: 2022-10-27 — End: 2023-07-27

## 2022-10-27 NOTE — Telephone Encounter (Signed)
Rx sent to the pharmacy.

## 2022-10-27 NOTE — Telephone Encounter (Signed)
Prescription Request  10/27/2022  LOV: 10/11/2022  What is the name of the medication or equipment? levothyroxine (SYNTHROID) 25 MCG tablet [[id number]]   Have you contacted your pharmacy to request a refill? No   Which pharmacy would you like this sent to?  [facility] [id number] [place], [place] - [address] [place] [address] Phone: [phone] Fax: [phone]    Patient notified that their request is being sent to the clinical staff for review and that they should receive a response within 2 business days.   Please advise at Mobile [phone] (mobile)   Pt said she really need this medication

## 2023-01-22 ENCOUNTER — Other Ambulatory Visit: Payer: Self-pay | Admitting: Internal Medicine

## 2023-01-22 DIAGNOSIS — L680 Hirsutism: Secondary | ICD-10-CM

## 2023-01-22 DIAGNOSIS — E88819 Insulin resistance, unspecified: Secondary | ICD-10-CM

## 2023-01-22 DIAGNOSIS — E282 Polycystic ovarian syndrome: Secondary | ICD-10-CM

## 2023-01-24 ENCOUNTER — Ambulatory Visit (INDEPENDENT_AMBULATORY_CARE_PROVIDER_SITE_OTHER): Payer: Medicaid Other | Admitting: Internal Medicine

## 2023-01-24 ENCOUNTER — Encounter: Payer: Self-pay | Admitting: Internal Medicine

## 2023-01-24 ENCOUNTER — Other Ambulatory Visit (HOSPITAL_COMMUNITY)
Admission: RE | Admit: 2023-01-24 | Discharge: 2023-01-24 | Disposition: A | Discharge status: Home / Self Care | Payer: Medicaid Other | Source: Ambulatory Visit | Attending: Internal Medicine | Admitting: Internal Medicine

## 2023-01-24 VITALS — BP 128/74 | HR 100 | Temp 98.1°F | Ht 66.0 in | Wt 218.2 lb

## 2023-01-24 DIAGNOSIS — L680 Hirsutism: Secondary | ICD-10-CM | POA: Diagnosis not present

## 2023-01-24 DIAGNOSIS — E282 Polycystic ovarian syndrome: Secondary | ICD-10-CM

## 2023-01-24 DIAGNOSIS — F411 Generalized anxiety disorder: Secondary | ICD-10-CM | POA: Diagnosis not present

## 2023-01-24 DIAGNOSIS — Z124 Encounter for screening for malignant neoplasm of cervix: Secondary | ICD-10-CM | POA: Diagnosis present

## 2023-01-24 MED ORDER — HYDROXYZINE HCL 10 MG PO TABS
10.0000 mg | ORAL_TABLET | Freq: Three times a day (TID) | ORAL | 2 refills | Status: AC | PRN
Start: 2023-01-24 — End: ?

## 2023-01-24 NOTE — Progress Notes (Signed)
 [name] [address] [place] [address] Dept: [phone] Dept Fax: [phone]    Subjective:   Ann Hammond Jul 25, 1990 01/24/2023  Chief Complaint  Patient presents with   Gynecologic Exam    HPI: Ann Hammond presents today for re-assessment and management of chronic medical conditions.  Discussed the use of AI scribe software for clinical note transcription with the patient, who gave verbal consent to proceed.  History of Present Illness   The patient, with a history of anxiety and Polycystic Ovary Syndrome (PCOS), presents for a routine Pap smear. The last Pap smear was approximately three years ago, prior to the birth of her daughter.  Regarding her anxiety, the patient reports that it is situational and tends to worsen when she has a lot to do and her children are being demanding. She estimates that she experiences anxiety on 60-70% of days. She has been taking buspirone , but it causes her to feel dizzy and 'swimmy-headed.' She has previously taken Zoloft and alprazolam as needed.  The patient also discusses her experience with medications for PCOS. She has been taking metformin  and spironolactone . She reports that the metformin  causes some constipation at times. She has noticed a decrease in hair growth since starting spironolactone . She has been off her medications for about three weeks due to forgetting them during a trip to [place].       The following portions of the patient's history were reviewed and updated as appropriate: past medical history, past surgical history, family history, social history, allergies, medications, and problem list.   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 Takes full 10 days provera, spots 3 days and period started heavy x 5 days after 3 days of spotting. Will do CD#1 as first day of bleeding. New plan: Letrozole 5mg  CD#3 labs every cycle CD#21 P e very cycle Repeat TSH with DAY 3 labs this cycle. (will count CD3 as firs   Rh negative state in antepartum period 07/12/2021   Third degree laceration of perineum during delivery, postpartum 01/29/2022   Past Surgical History:  Procedure Laterality Date   APPENDECTOMY     Family History  Problem Relation Age of Onset   Hypertension Father    CAD Father    Hyperlipidemia Maternal Grandmother    Diabetes Maternal Grandmother    Lung cancer Maternal Grandfather    Pancreatic cancer Paternal Grandmother    Diabetes Other    Anesthesia problems Neg Hx    Malignant hyperthermia Neg Hx    Pseudochol deficiency Neg Hx    Hypotension Neg Hx     Current Outpatient Medications:    Cholecalciferol (VITAMIN D3) 50 MCG (2000 UT) TABS, Take 2,000 Units by mouth daily., Disp: , Rfl:    hydrOXYzine  (ATARAX ) 10 MG tablet, Take 1 tablet (10 mg total) by mouth 3 (three) times daily as needed for anxiety., Disp: 30 tablet, Rfl: 2   levothyroxine  (SYNTHROID ) 25 MCG tablet,  Take 1 tablet (25 mcg total) by mouth daily before breakfast., Disp: 90 tablet, Rfl: 2   metFORMIN  (GLUCOPHAGE -XR) 500 MG 24 hr tablet, TAKE 1 TABLET BY MOUTH DAILY WITH BREAKFAST, Disp: 90 tablet, Rfl: 1   spironolactone  (ALDACTONE ) 50 MG tablet, TAKE 1 TABLET BY MOUTH DAILY, Disp: 90 tablet, Rfl: 1   phentermine  30 MG capsule, Take 1 capsule (30 mg total) by mouth every morning., Disp: 30 capsule, Rfl: 0 No  Known Allergies   ROS: A complete ROS was performed with pertinent positives/negatives noted in the HPI. The remainder of the ROS are negative.    Objective:   Today's Vitals   01/24/23 0855  BP: 128/74  Pulse: 100  Temp: 98.1 F (36.7 C)  TempSrc: Temporal  SpO2: 99%  Weight: 218 lb 3.2 oz (99 kg)  Height: 5' 6 (1.676 m)    GENERAL: Well-appearing, in NAD. Well nourished.  SKIN: Pink, warm and dry. RESPIRATORY: Chest wall symmetrical. Respirations even and non-labored. GU: External genitalia without erythema, lesions, or masses. No lymphadenopathy. Vaginal mucosa pink and moist without exudate, lesions, or ulcerations. Cervix pink without discharge. Cervical os closed. Uterus and adnexae palpable, not enlarged, and w/o tenderness. No palpable masses. Chaperoned by Angelyn Hollingsworth CMA NEUROLOGIC: Steady, even gait.  PSYCH/MENTAL STATUS: Alert, oriented x 3. Cooperative, appropriate mood and affect.   Health Maintenance Due  Topic Date Due   Cervical Cancer Screening (HPV/Pap Cotest)  Never done    No results found for any visits on 01/24/23.  The ASCVD Risk score (Arnett DK, et al., 2019) failed to calculate for the following reasons:   The 2019 ASCVD risk score is only valid for ages 23 to 82     Assessment & Plan:  Assessment and Plan    Routine Cervical Cancer Screening Overdue for Pap smear, last performed approximately 3 years ago. -Perform Pap smear today.  Anxiety Reports situational anxiety, exacerbated by stressors. Buspirone  caused dizziness and was discontinued. Patient has previously taken Zoloft and alprazolam. -Trial of hydroxyzine  10mg  as needed for anxiety.  Polycystic Ovary Syndrome (PCOS), Hirsutism Reports improvement in hirsutism with spironolactone . Metformin  causing constipation and discomfort. Menstrual cycle regular when adherent to medication regimen. -Continue spironolactone . -Continue metformin , advise taking with food to minimize  gastrointestinal side effects. If symptoms persist, consider discontinuation and discussion with OB/GYN regarding potential use of oral contraceptives for hormone regulation.  Follow-up in 4 months or sooner if needed, with the option for a virtual visit to discuss anxiety management.      No images are attached to the encounter or orders placed in the encounter. Meds ordered this encounter  Medications   hydrOXYzine  (ATARAX ) 10 MG tablet    Sig: Take 1 tablet (10 mg total) by mouth 3 (three) times daily as needed for anxiety.    Dispense:  30 tablet    Refill:  2    Supervising Provider:   THOMPSON, AARON B [[id number]]    Return in about 4 months (around 05/24/2023) for Chronic Condition follow up.   Rosina Senters, FNP

## 2023-01-25 ENCOUNTER — Encounter: Payer: Self-pay | Admitting: Internal Medicine

## 2023-01-25 LAB — CYTOLOGY - PAP
Comment: NEGATIVE
Diagnosis: NEGATIVE
High risk HPV: NEGATIVE

## 2023-03-01 ENCOUNTER — Other Ambulatory Visit: Payer: Self-pay | Admitting: Internal Medicine

## 2023-03-01 DIAGNOSIS — F411 Generalized anxiety disorder: Secondary | ICD-10-CM

## 2023-03-30 ENCOUNTER — Encounter: Payer: Self-pay | Admitting: Internal Medicine

## 2023-04-20 ENCOUNTER — Ambulatory Visit: Admitting: Internal Medicine

## 2023-05-03 ENCOUNTER — Ambulatory Visit (INDEPENDENT_AMBULATORY_CARE_PROVIDER_SITE_OTHER): Admitting: Internal Medicine

## 2023-05-03 ENCOUNTER — Encounter: Payer: Self-pay | Admitting: Internal Medicine

## 2023-05-03 VITALS — BP 102/80 | HR 81 | Temp 98.1°F | Ht 66.0 in | Wt 221.4 lb

## 2023-05-03 DIAGNOSIS — E039 Hypothyroidism, unspecified: Secondary | ICD-10-CM | POA: Diagnosis not present

## 2023-05-03 DIAGNOSIS — L680 Hirsutism: Secondary | ICD-10-CM | POA: Diagnosis not present

## 2023-05-03 DIAGNOSIS — E282 Polycystic ovarian syndrome: Secondary | ICD-10-CM

## 2023-05-03 DIAGNOSIS — L659 Nonscarring hair loss, unspecified: Secondary | ICD-10-CM | POA: Diagnosis not present

## 2023-05-03 MED ORDER — MINOXIDIL FOR WOMEN 5 % EX FOAM: CUTANEOUS | 2 refills | Status: AC

## 2023-05-03 MED ORDER — SPIRONOLACTONE 100 MG PO TABS: 100.0000 mg | ORAL_TABLET | Freq: Every day | ORAL | 1 refills | Status: DC

## 2023-05-03 NOTE — Progress Notes (Signed)
 [facility] [address] [place] [address] Dept: [phone] Dept Fax: [phone]  Acute Care Office Visit  Subjective:   Ann Hammond 1990/12/18 05/03/2023  Chief Complaint  Patient presents with   Alopecia    HPI: Discussed the use of AI scribe software for clinical note transcription with the patient, who gave verbal consent to proceed.  History of Present Illness   Ann Hammond is a 33 year old female with PCOS and hypothyroidism who presents with hair loss.  Her hair loss began shortly after her last visit with PCP in January 2025 . She attributes some of the hair loss to stress from selling her house, although she is no longer under that stress. The hair loss occurs throughout her scalp, with thinning noticeable when her hair is wet or brushed. She can pull out hair easily, especially in the shower, but does not have any bald spots. Her hair feels thinner, particularly on the sides when it is up.  She is currently taking spironolactone  50mg  once a day for female hirsutism associated with PCOS. Initially, the spironolactone  worked well for facial hair growth, but she has noticed decreased effectiveness over the past couple months , around same time her hair loss started.  She stopped taking metformin  500 mcg due to gastrointestinal intolerance for PCOS management. She has not had a menstrual period since January and has noticed a slight weight gain of a couple of pounds.  She takes levothyroxine  first thing in the morning for hypothyroidism but not always on an empty stomach or consistently before other medications. She has started taking a vitamin supplement for hair regrowth but is unsure of its effectiveness.      The following portions of the patient's history were reviewed and updated as appropriate: past medical history, past surgical history, family history, social history, allergies, medications, and  problem list.   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 Takes full 10 days provera, spots 3 days and period started heavy x 5 days after 3 days of spotting. Will do CD#1 as first day of bleeding. New plan: Letrozole 5mg  CD#3 labs every cycle CD#21 P e very cycle Repeat TSH with DAY 3 labs this cycle. (will count CD3 as firs   Rh negative state in antepartum period 07/12/2021   Third degree laceration of perineum during delivery, postpartum 01/29/2022   Past Surgical History:  Procedure Laterality Date   APPENDECTOMY     Family History  Problem Relation Age of Onset   Hypertension Father    CAD Father    Hyperlipidemia Maternal Grandmother    Diabetes Maternal Grandmother    Lung cancer Maternal Grandfather    Pancreatic cancer Paternal Grandmother    Diabetes Other    Anesthesia problems Neg Hx    Malignant hyperthermia Neg Hx    Pseudochol deficiency Neg Hx    Hypotension Neg Hx     Current Outpatient Medications:  Cholecalciferol (VITAMIN D3) 50 MCG (2000 UT) TABS, Take 2,000 Units by mouth daily., Disp: , Rfl:    hydrOXYzine  (ATARAX ) 10 MG tablet, Take 1 tablet (10 mg total) by mouth 3 (three) times daily as needed for anxiety., Disp: 30 tablet, Rfl: 2   levothyroxine  (SYNTHROID ) 25 MCG tablet, Take 1 tablet (25 mcg total) by mouth daily before breakfast., Disp: 90 tablet, Rfl: 2   Minoxidil  (MINOXIDIL  FOR WOMEN) 5 % FOAM, Apply  1/2 capful once daily, Disp: 60 g, Rfl: 2   spironolactone  (ALDACTONE ) 100 MG tablet, Take 1 tablet (100 mg total) by mouth daily., Disp: 90 tablet, Rfl: 1 Allergies  Allergen Reactions   Metformin  And Related Nausea And Vomiting     ROS: A complete ROS was performed with pertinent positives/negatives noted in the HPI. The remainder of the ROS are negative.    Objective:   Today's Vitals   05/03/23 1012  BP: 102/80  Pulse: 81  Temp: 98.1 F (36.7 C)  TempSrc: Temporal  SpO2: 99%  Weight: 221 lb 6.4 oz (100.4 kg)  Height: 5\' 6"  (1.676 m)    GENERAL: Well-appearing, in NAD. Well nourished.  SKIN: Pink, warm and dry. No rash, lesion, ulceration, or ecchymoses. Diffuse hair thinning, no bald patches.  NECK: Trachea midline. Full ROM w/o pain or tenderness. No lymphadenopathy. No thyromegaly or palpable masses.  RESPIRATORY: Chest wall symmetrical. Respirations even and non-labored. Breath sounds clear to auscultation bilaterally.  CARDIAC: S1, S2 present, regular rate and rhythm. Peripheral pulses 2+ bilaterally.  MSK: Muscle tone and strength appropriate for age.  EXTREMITIES: Without clubbing, cyanosis, or edema.  NEUROLOGIC:  Steady, even gait.  PSYCH/MENTAL STATUS: Alert, oriented x 3. Cooperative, appropriate mood and affect.    No results found for any visits on 05/03/23.    Assessment & Plan:  Assessment and Plan    Polycystic Ovary Syndrome (PCOS) and Hirsutism PCOS with hair loss and hirsutism. Stress and hormonal imbalance contribute. Spironolactone  effective previously.  - Increase spironolactone  to 100 mg once daily. - D/C Metformin  d/t GI intolerance - Discuss permanent hair removal options if spironolactone  is insufficient.  Hair loss Hair loss linked to stress, PCOS, and possible thyroid  dysfunction. Hair is thinner, no alopecia. Discussed spironolactone  and minoxidil  benefits. - Order thyroid  panel to evaluate thyroid  function. - Increase spironolactone  to  100 mg once daily. - Recommend minoxidil  shampoo for hair regrowth.  Hypothyroidism Managed with levothyroxine  25 mcg daily. Possible symptom exacerbation due to suboptimal levels. Inconsistent intake may affect absorption. - Order thyroid  panel to assess current thyroid  function. - Advise taking levothyroxine  on an empty stomach, 30 minutes before eating or other medications.   Follow-up Plan to monitor thyroid  function and adjust treatment. She will be out of town but has refilled medications. - Schedule follow-up appointment in six months for physical examination. - Contact with thyroid  panel results and adjust levothyroxine  dosage if necessary. - Advise to return sooner if symptoms persist or worsen.      Meds ordered this encounter  Medications   spironolactone  (ALDACTONE ) 100 MG tablet    Sig: Take 1 tablet (100 mg total) by mouth daily.    Dispense:  90 tablet    Refill:  1    Supervising Provider:   THOMPSON, AARON B [[id number]]   Minoxidil  (MINOXIDIL  FOR WOMEN) 5 % FOAM    Sig: Apply 1/2 capful once daily    Dispense:  60 g    Refill:  2    Supervising Provider:  THOMPSON, AARON B [[id number]]   Orders Placed This Encounter  Procedures   Thyroid  Panel With TSH   Lab Orders         Thyroid  Panel With TSH     No images are attached to the encounter or orders placed in the encounter.  Return in about 6 months (around 11/02/2023) for Annual Physical Exam with fasting lab work.   Gavin Kast, FNP

## 2023-05-04 ENCOUNTER — Encounter: Payer: Self-pay | Admitting: Internal Medicine

## 2023-05-04 LAB — THYROID PANEL WITH TSH
Free Thyroxine Index: 2 (ref 1.4–3.8)
T3 Uptake: 28 % (ref 22–35)
T4, Total: 7 ug/dL (ref 5.1–11.9)
TSH: 3.32 m[IU]/L

## 2023-07-27 ENCOUNTER — Other Ambulatory Visit: Payer: Self-pay | Admitting: Internal Medicine

## 2023-07-27 DIAGNOSIS — E039 Hypothyroidism, unspecified: Secondary | ICD-10-CM

## 2023-07-27 DIAGNOSIS — E282 Polycystic ovarian syndrome: Secondary | ICD-10-CM

## 2023-07-27 DIAGNOSIS — L680 Hirsutism: Secondary | ICD-10-CM

## 2023-10-11 ENCOUNTER — Other Ambulatory Visit: Payer: Self-pay | Admitting: Internal Medicine

## 2023-10-11 DIAGNOSIS — E282 Polycystic ovarian syndrome: Secondary | ICD-10-CM

## 2023-10-11 DIAGNOSIS — L680 Hirsutism: Secondary | ICD-10-CM
